# Patient Record
Sex: Female | Born: 1960 | Race: White | Hispanic: No | Marital: Married | Smoking: Never smoker
Health system: Southern US, Community
[De-identification: ages and names within clinical notes are randomized; demographics above are authoritative.]

## PROBLEM LIST (undated history)

## (undated) DIAGNOSIS — R609 Edema, unspecified: Secondary | ICD-10-CM

## (undated) DIAGNOSIS — E785 Hyperlipidemia, unspecified: Secondary | ICD-10-CM

## (undated) DIAGNOSIS — C801 Malignant (primary) neoplasm, unspecified: Secondary | ICD-10-CM

## (undated) DIAGNOSIS — K219 Gastro-esophageal reflux disease without esophagitis: Secondary | ICD-10-CM

## (undated) DIAGNOSIS — G4730 Sleep apnea, unspecified: Secondary | ICD-10-CM

## (undated) DIAGNOSIS — K2270 Barrett's esophagus without dysplasia: Secondary | ICD-10-CM

## (undated) DIAGNOSIS — I10 Essential (primary) hypertension: Secondary | ICD-10-CM

## (undated) HISTORY — DX: Edema, unspecified: R60.9

## (undated) HISTORY — DX: Sleep apnea, unspecified: G47.30

## (undated) HISTORY — PX: NASAL SEPTUM SURGERY: SHX37

## (undated) HISTORY — DX: Barrett's esophagus without dysplasia: K22.70

## (undated) HISTORY — PX: ABDOMINAL HYSTERECTOMY: SHX81

## (undated) HISTORY — DX: Essential (primary) hypertension: I10

## (undated) HISTORY — DX: Hyperlipidemia, unspecified: E78.5

---

## 1999-07-13 ENCOUNTER — Ambulatory Visit (HOSPITAL_COMMUNITY): Admission: RE | Admit: 1999-07-13 | Discharge: 1999-07-13 | Payer: Self-pay | Admitting: Obstetrics and Gynecology

## 1999-07-13 ENCOUNTER — Encounter: Payer: Self-pay | Admitting: Obstetrics and Gynecology

## 2000-09-26 ENCOUNTER — Ambulatory Visit (HOSPITAL_COMMUNITY): Admission: RE | Admit: 2000-09-26 | Discharge: 2000-09-26 | Payer: Self-pay | Admitting: Gastroenterology

## 2000-09-26 ENCOUNTER — Encounter (INDEPENDENT_AMBULATORY_CARE_PROVIDER_SITE_OTHER): Payer: Self-pay | Admitting: Specialist

## 2001-10-01 ENCOUNTER — Other Ambulatory Visit: Admission: RE | Admit: 2001-10-01 | Discharge: 2001-10-01 | Payer: Self-pay | Admitting: Obstetrics and Gynecology

## 2002-12-18 ENCOUNTER — Other Ambulatory Visit: Admission: RE | Admit: 2002-12-18 | Discharge: 2002-12-18 | Payer: Self-pay | Admitting: Obstetrics and Gynecology

## 2002-12-30 ENCOUNTER — Ambulatory Visit (HOSPITAL_COMMUNITY): Admission: RE | Admit: 2002-12-30 | Discharge: 2002-12-30 | Payer: Self-pay | Admitting: Obstetrics and Gynecology

## 2002-12-30 ENCOUNTER — Encounter: Payer: Self-pay | Admitting: Obstetrics and Gynecology

## 2003-03-19 ENCOUNTER — Ambulatory Visit (HOSPITAL_COMMUNITY): Admission: RE | Admit: 2003-03-19 | Discharge: 2003-03-19 | Payer: Self-pay | Admitting: Gastroenterology

## 2003-03-19 ENCOUNTER — Encounter (INDEPENDENT_AMBULATORY_CARE_PROVIDER_SITE_OTHER): Payer: Self-pay | Admitting: *Deleted

## 2004-01-19 ENCOUNTER — Other Ambulatory Visit: Admission: RE | Admit: 2004-01-19 | Discharge: 2004-01-19 | Payer: Self-pay | Admitting: Obstetrics and Gynecology

## 2004-01-20 ENCOUNTER — Ambulatory Visit (HOSPITAL_COMMUNITY): Admission: RE | Admit: 2004-01-20 | Discharge: 2004-01-20 | Payer: Self-pay | Admitting: Obstetrics and Gynecology

## 2004-04-28 ENCOUNTER — Ambulatory Visit (HOSPITAL_COMMUNITY): Admission: RE | Admit: 2004-04-28 | Discharge: 2004-04-28 | Payer: Self-pay | Admitting: Gastroenterology

## 2004-04-28 ENCOUNTER — Encounter (INDEPENDENT_AMBULATORY_CARE_PROVIDER_SITE_OTHER): Payer: Self-pay | Admitting: *Deleted

## 2005-02-23 ENCOUNTER — Ambulatory Visit (HOSPITAL_COMMUNITY): Admission: RE | Admit: 2005-02-23 | Discharge: 2005-02-23 | Payer: Self-pay | Admitting: Obstetrics and Gynecology

## 2005-03-15 ENCOUNTER — Other Ambulatory Visit: Admission: RE | Admit: 2005-03-15 | Discharge: 2005-03-15 | Payer: Self-pay | Admitting: Obstetrics and Gynecology

## 2006-02-27 ENCOUNTER — Ambulatory Visit (HOSPITAL_COMMUNITY): Admission: RE | Admit: 2006-02-27 | Discharge: 2006-02-27 | Payer: Self-pay | Admitting: Obstetrics and Gynecology

## 2006-03-20 ENCOUNTER — Other Ambulatory Visit: Admission: RE | Admit: 2006-03-20 | Discharge: 2006-03-20 | Payer: Self-pay | Admitting: Obstetrics and Gynecology

## 2006-09-07 ENCOUNTER — Ambulatory Visit (HOSPITAL_COMMUNITY): Admission: RE | Admit: 2006-09-07 | Discharge: 2006-09-07 | Payer: Self-pay | Admitting: Obstetrics and Gynecology

## 2007-03-05 ENCOUNTER — Ambulatory Visit (HOSPITAL_COMMUNITY): Admission: RE | Admit: 2007-03-05 | Discharge: 2007-03-05 | Payer: Self-pay | Admitting: Obstetrics and Gynecology

## 2008-03-05 ENCOUNTER — Ambulatory Visit (HOSPITAL_COMMUNITY): Admission: RE | Admit: 2008-03-05 | Discharge: 2008-03-05 | Payer: Self-pay | Admitting: Obstetrics and Gynecology

## 2009-03-18 ENCOUNTER — Ambulatory Visit (HOSPITAL_COMMUNITY): Admission: RE | Admit: 2009-03-18 | Discharge: 2009-03-18 | Payer: Self-pay | Admitting: Obstetrics and Gynecology

## 2009-03-29 ENCOUNTER — Encounter: Admission: RE | Admit: 2009-03-29 | Discharge: 2009-03-29 | Payer: Self-pay | Admitting: Obstetrics and Gynecology

## 2009-04-02 ENCOUNTER — Encounter: Admission: RE | Admit: 2009-04-02 | Discharge: 2009-04-02 | Payer: Self-pay | Admitting: Family Medicine

## 2009-04-05 ENCOUNTER — Encounter: Admission: RE | Admit: 2009-04-05 | Discharge: 2009-04-05 | Payer: Self-pay | Admitting: Family Medicine

## 2009-10-13 ENCOUNTER — Encounter: Admission: RE | Admit: 2009-10-13 | Discharge: 2009-10-13 | Payer: Self-pay | Admitting: Obstetrics and Gynecology

## 2010-03-23 ENCOUNTER — Encounter: Admission: RE | Admit: 2010-03-23 | Discharge: 2010-03-23 | Payer: Self-pay | Admitting: Obstetrics and Gynecology

## 2010-10-07 NOTE — H&P (Signed)
[name], [name]              ACCOUNT NO.:  [id number]   MEDICAL RECORD NO.:  [id number]          PATIENT TYPE:  AMB   LOCATION:  SDC                           FACILITY:  [facility]   PHYSICIAN:  Hal Morales, M.D.DATE OF BIRTH:  August 21, 1960   DATE OF ADMISSION:  09/07/2006  DATE OF DISCHARGE:                              HISTORY & PHYSICAL   The patient is a 50 year old white married female, para 2-0-1-2, who  presents for endometrial ablation for menorrhagia.  The patient had a 5-  year history of heavy menstrual periods which were originally lasting a  total of 5 days with no intermenstrual bleeding and minimal cramping.  Her initial workup in 2001 included a pelvic ultrasound which showed a  1.4 cm fibroid that was not submucosal.  She was started in 2003 on oral  contraceptive pills with good response of her menorrhagia.  She began,  however, to have premenstrual headaches each month.  This had been a  problem previously but on the Yasmin she had them essentially every  month.  She was started on Vivelle patch three days prior to the  beginning of her placebo pills.  She began to have some edema during the  perimenstrual time and was started on hydrochlorothiazide in 2005.  She  tolerated this regimen reasonably well and was able to decrease her  Vivelle patch to 0.0375.  She discontinued the Vivelle in 2006 and  remained without headaches.  She continued to have significant bloating  on the Yasmin and we began to discuss other options for management of  her menorrhagia.  She discontinued the Yasmin and began Motrin 600 mg  q.6h. for the 24 hours that she typically had the heavy bleeding that  was enough to soak through all of her protection and to soil her  clothing.  This did not offer her adequate relief and after discussing  the options of returning to oral contraceptive pills, Mirena IUD,  hysterectomy and endometrial ablation, decided to proceed with  endometrial  ablation.   PAST HISTORY:  Obstetrical:  The patient had cesarean sections for  breech presentation with toxemia and in 1993 as an elective repeat  cesarean section.   GYN history:  The patient had menarche at age 69 with menses every 30  days, usually lasting 4-5 days, with no intermenstrual bleeding and  cramps.  The patient had a history of an abnormal Pap smear that was  treated with laser surgery in 1996 but has had normal Pap smears since  being followed at [facility] since 2001.  Contraception:  Vasectomy.   Medical history:  The patient has a history of gastroesophageal reflux  disease and migraine headaches.   Surgical history:  Third molar extraction, cesarean section x2, and  laser surgery of the cervix.   CURRENT MEDICATIONS:  1. Nexium 40 mg daily.  2. Multivitamin.   HABITS:  The patient eats a regular diet, occasionally exercises, and  occasionally does a self breast examination.  She does not smoke or use  recreational drugs.  She drinks alcoholic beverages approximately twice  per  month.  She works part-time in a clerical position.   REVIEW OF SYSTEMS:  Negative except as mentioned above.   PHYSICAL EXAMINATION:  GENERAL:  The patient is a well-developed white  female in no acute distress.  VITAL SIGNS:  Blood pressure is 120/80, weight is 203 pounds, height is  5 feet 10-1/2 inches.  HEENT:  Within normal limits.  LUNGS:  Clear.  HEART:  Regular rate and rhythm.  BREASTS:  No dominant masses, discharge or skin changes.  ABDOMEN:  Soft without palpable masses or organomegaly though the  examination is compromised by patient's habitus.  EXTREMITIES:  No clubbing, cyanosis, or edema.  PELVIC:  EG/BUS within normal limits.  The vagina is rugous.  The cervix  is without gross lesions.  The uterus is upper limits of normal size,  posterior, and nontender.  Adnexa:  No masses.  Rectovaginal:  No  masses.   ADDITIONAL STUDIES:  Pelvic ultrasound  performed on August 28, 2006,  showed a posterior fibroid measuring 1.5 cm, anterior fibroid measuring  1.7 cm.  The sonohysterogram shows a question of two hyperechoic masses  on the anterior wall, both less than 2 cm, but no submucosal fibroids.  The endometrial measurement by ultrasound is 3.59 x 6.06 cm.  Endometrial biopsy from August 28, 2006, shows benign proliferative  endometrium with no hyperplasia or malignancy.   IMPRESSION:  1. Menorrhagia with the patient having failed oral contraceptive pills      and nonsteroidal anti-inflammatory agents.  2. Perimenstrual headaches exacerbated by oral contraceptives.  3. Small uterine fibroids, which have been stable over the last 7      years.  4. Suggestion of endometrial thickening measuring less than 2 cm each.   DISPOSITION:  The patient has opted for endometrial ablation, and that  will be done at [facility] on September 07, 2006.  The risks of  anesthesia, bleeding, infection, damage to adjacent organs, and uterine  perforation precluding the completion of the procedure, have all been  discussed with the patient.  There has also been a discussion concerning  the possibility of decreased opportunity for efficacy of endometrial  ablation secondary to the patient's anatomic defect of fibroids.  She  expressed understanding and wishes to proceed with endometrial ablation.      Hal Morales, M.D.  Electronically Signed     VPH/MEDQ  D:  09/07/2006  T:  09/07/2006  Job:  [id number]

## 2010-10-07 NOTE — Op Note (Signed)
NAMEORIEL, OJO              ACCOUNT NO.:  [id number]   MEDICAL RECORD NO.:  [id number]          PATIENT TYPE:  AMB   LOCATION:  ENDO                         FACILITY:  [facility]   PHYSICIAN:  Bernette Redbird, M.D.   DATE OF BIRTH:  [birth year]   DATE OF PROCEDURE:  04/28/2004  DATE OF DISCHARGE:                                 OPERATIVE REPORT   PROCEDURE PERFORMED:  Upper endoscopy with biopsies.   ENDOSCOPIST:  [name], M.D.   INDICATIONS FOR PROCEDURE:  The patient is a 50 year old with reflux disease  and short segment Barrett's esophagus, with biopsies a year ago being  indeterminate for low grade dysplasia.   FINDINGS:  Short segment Barrett's esophagus above small hiatal hernia.   DESCRIPTION OF PROCEDURE:  The nature, purpose and risks of the procedure  were familiar to the patient, who provided written consent.  Sedation was  fentanyl  75 mcg and Versed 8 mg IV without arrhythmias or desaturation.  The Olympus video endoscope was passed under direct vision.  The esophagus  was pertinent for a short segment Barrett's esophagus in two tongues, the  longer one extending perhaps 2 cm up the esophagus.  These areas were  biopsied.  There was no evidence of neoplasia, reflux esophagitis, varices,  infection or neoplasia.  No ring or stricture was evident.  Some mild reflux  up into the esophagus was noted during passage of the scope.   The stomach contained essentially no residual and had unremarkable mucosa  without evidence of gastritis, erosions, ulcers, polyps or masses.  A small  hiatal hernia was seen on retroflex viewing of the cardia.  The pylorus,  duodenal bulb and second duodenum were unremarkable.  The patient tolerated  the procedure well and there were no apparent complications.   IMPRESSION:  Short segment Barrett's esophagus, biopsied (16109).   PLAN:  Await pathology results with the interval for the patient's next  endoscopy being dependent on  the histologic findings.       RB/MEDQ  D:  04/28/2004  T:  04/29/2004  Job:  [id number]   cc:   [facility] [place]

## 2010-10-07 NOTE — Procedures (Signed)
[place]  Patient:    [name], [name]                     MRN: [id number] Proc. Date: 09/26/00 Adm. Date:  [id number] Attending:  Rich Brave CC:         Meredith Staggers, M.D.   Procedure Report  PROCEDURE:  Upper endoscopy with biopsies.  ENDOSCOPIST:  [name], M.D.  INDICATIONS:  Longstanding, severe reflux disease with a family history of esophageal cancer which is presumed to have arisen in Barretts esophagus.  FINDINGS:  Short segment Barretts esophagus above a small hiatal hernia.  DESCRIPTION OF PROCEDURE:  The nature, purpose, and risks of the procedure had been discussed with the patient who provided written consent.   Sedation was fentanyl 75 mcg and Versed 7.5 mg IV without arrhythmias or desaturation.  The Olympus video endoscope was passed under direct vision, entering the esophagus easily.  The larynx appeared to have a little bit of edema posteriorly in the region of the posterior commissure.  The esophagus was entered under direct vision.  The proximal esophagus was normal, but the distal esophagus had several short tongues of Barretts-appearing mucosa extending the maximum of about 2 cm up the esophagus.  Biopsies from these tongues were obtained prior to removal of the scope.  No ring or stricture was appreciated, and there was no evidence of reflux esophagitis, free gastroesophageal reflux, varices, infection, or neoplasia.  There was a 2 cm hiatal hernia.  The stomach had no gastritis, erosions, ulcers, polyps, or masses. Retroflexed view showed a slightly patulous diaphragmatic hiatus.  The pylorus, duodenal bulb, and second duodenum looked normal.  The patient tolerated the procedure well, and there were no apparent complications.  IMPRESSION: 1. Short segment Barretts esophagus, pending. 2. Small hiatal hernia.  PLAN:  Await results on biopsies with further management and followup  to depend on the histologic findings. DD:  09/26/00 TD:  09/26/00 Job: [id number]

## 2010-10-07 NOTE — Op Note (Signed)
[name], [name]              ACCOUNT NO.:  [id number]   MEDICAL RECORD NO.:  [id number]          PATIENT TYPE:  AMB   LOCATION:  SDC                           [facility]ACILITY[facility]  [facility]   PHYSICIAN:  Hal Morales, M.D.DATE OF BIRTH:  02/27/1961   DATE OF PROCEDURE:  09/07/2006  DATE OF DISCHARGE:                               OPERATIVE REPORT   PREOPERATIVE DIAGNOSES:  1. Menorrhagia.  2. Uterine fibroids.   POSTOPERATIVE DIAGNOSES:  1. Menorrhagia.  2. Uterine fibroids.   OPERATION:  NovaSure endometrial ablation.   SURGEON:  Dr. Dierdre Forth.   ANESTHESIA:  Local and monitored anesthesia care.   ESTIMATED BLOOD LOSS:  Less than 10 mL.   COMPLICATIONS:  None.   FINDINGS:  The uterus sounded to 9 cm and was posterior.  The cervical  length measured 3.5 cm.  Cavity width measured 4.7 cm.   PROCEDURE:  The patient was taken to the operating room after  appropriate identification and placed on the operating table.  After  placement of equipment for monitored anesthesia care, she was placed in  the lithotomy position.  The perineum and vagina were prepped with  multiple layers of Betadine and draped.  The Graves speculum was placed  in the vagina and a paracervical block achieved with a total of 17 mL of  a mixture of 50% 1% Xylocaine and 50% 0.25% Marcaine for total of 17 mL  in the 5, 7 and 12 o'clock positions.  The cervical length was measured.  The uterus was sounded.  The cervix was dilated to 8 mm, and the  NovaSure apparatus placed into the endometrial cavity and the array  opened.  The array was then seated with the cavity with maximizing at  4.7 cm.  The cervical sleeve was then placed adjacent to the cervix and  the cavity assessment begun.  Once the cavity assessment was passed, the  endometrial ablation was begun and completed in 1 minute and 14 seconds.  The array was then retracted into the apparatus and the apparatus  removed.  The single-toothed tenaculum  which had been placed on the  cervix for stabilization was then removed.  Monsel's solution was used  to achieve hemostasis from the area of tenaculum placement, and the  patient tolerated the procedure well and was taken to the recovery room  in satisfactory condition.      Hal Morales, M.D.  Electronically Signed     VPH/MEDQ  D:  09/07/2006  T:  09/07/2006  Job:  [id number]

## 2010-10-07 NOTE — Op Note (Signed)
   NAME:  [name], [name]                        ACCOUNT NO.:  [id number]   MEDICAL RECORD NO.:  [id number]                   PATIENT TYPE:  AMB   LOCATION:  ENDO                                 FACILITY:  [facility]   PHYSICIAN:  Bernette Redbird, M.D.                DATE OF BIRTH:  1960/12/05   DATE OF PROCEDURE:  03/19/2003  DATE OF DISCHARGE:                                 OPERATIVE REPORT   PROCEDURE:  Upper endoscopy with biopsies.   PERSON:  Bernette Redbird, M.D.   INDICATIONS FOR PROCEDURE:  Follow-up of short segment of Barrett's  esophagus, last biopsied two years ago and noted to have no evidence of  dysplasia at that time.  The patient is maintained on Nexium.   FINDINGS:  Short segment of Barrett's esophagus above 4 cm hiatal hernia.   PROCEDURE:  The nature, purpose and risks of the procedure were familiar to  the patient from prior examination and she provided written consent.  Sedation was Fentanyl 80 mcg and Versed 8 mg IV without arrhythmias or  desaturation.  The Olympus small caliber adult video endoscope was passed  under direct vision. The larynx looked normal.  The esophagus was readily  entered and was normal down to the very distal most portion of the esophagus  where there were several short tongues of jagged Barrett's appearing mucosa,  the longest of which was perhaps 2 cm above the region of the lower  esophageal sphincter and the others of which were 1 to 2 cm in length.  Several biopsies were obtained circumferentially from the area of these  tongues.  There was no evidence of any discrete ring or stricture.  There  was a 4 cm hiatal hernia with the lower esophageal sphincter at about 36 cm.   The stomach contained no residual and had normal mucosa without evidence of  gastritis, erosions, ulcers, polyps or masses.  Retroflex viewing showed a  small hiatal hernia.  The pylorus, duodenal bulb and second duodenum looked  normal.   After obtaining the above  mentioned biopsies from the Barrett's segment, the  scope was removed from the patient who tolerated the procedure well and  without apparent complications.   IMPRESSION:  Short segment of Barrett's esophagus.   PLAN:  Await pathology with anticipated probable colonoscopic follow-up in  about two years.                                               Bernette Redbird, M.D.   RB/MEDQ  D:  03/19/2003  T:  03/19/2003  Job:  [id number]   cc:   Meredith Staggers, M.D.  [address], [address]  [place]  [place] [address]  Fax: [phone]

## 2010-12-02 ENCOUNTER — Other Ambulatory Visit: Payer: Self-pay | Admitting: Internal Medicine

## 2010-12-02 DIAGNOSIS — Z8051 Family history of malignant neoplasm of kidney: Secondary | ICD-10-CM

## 2010-12-08 ENCOUNTER — Ambulatory Visit
Admission: RE | Admit: 2010-12-08 | Discharge: 2010-12-08 | Disposition: A | Discharge status: Home / Self Care | Payer: Managed Care, Other (non HMO) | Source: Ambulatory Visit | Attending: Internal Medicine | Admitting: Internal Medicine

## 2010-12-08 DIAGNOSIS — Z8051 Family history of malignant neoplasm of kidney: Secondary | ICD-10-CM

## 2011-02-16 ENCOUNTER — Other Ambulatory Visit (HOSPITAL_COMMUNITY): Payer: Self-pay | Admitting: Obstetrics and Gynecology

## 2011-02-16 DIAGNOSIS — Z1231 Encounter for screening mammogram for malignant neoplasm of breast: Secondary | ICD-10-CM

## 2011-03-30 ENCOUNTER — Ambulatory Visit (HOSPITAL_COMMUNITY)
Admission: RE | Admit: 2011-03-30 | Discharge: 2011-03-30 | Disposition: A | Discharge status: Home / Self Care | Payer: Managed Care, Other (non HMO) | Source: Ambulatory Visit | Attending: Obstetrics and Gynecology | Admitting: Obstetrics and Gynecology

## 2011-03-30 DIAGNOSIS — Z1231 Encounter for screening mammogram for malignant neoplasm of breast: Secondary | ICD-10-CM | POA: Insufficient documentation

## 2012-03-18 ENCOUNTER — Other Ambulatory Visit: Payer: Self-pay | Admitting: Obstetrics and Gynecology

## 2012-03-18 DIAGNOSIS — Z1231 Encounter for screening mammogram for malignant neoplasm of breast: Secondary | ICD-10-CM

## 2012-04-04 ENCOUNTER — Ambulatory Visit (HOSPITAL_COMMUNITY)
Admission: RE | Admit: 2012-04-04 | Discharge: 2012-04-04 | Disposition: A | Discharge status: Home / Self Care | Payer: Managed Care, Other (non HMO) | Source: Ambulatory Visit | Attending: Obstetrics and Gynecology | Admitting: Obstetrics and Gynecology

## 2012-04-04 DIAGNOSIS — Z1231 Encounter for screening mammogram for malignant neoplasm of breast: Secondary | ICD-10-CM | POA: Insufficient documentation

## 2012-04-22 ENCOUNTER — Ambulatory Visit: Payer: Self-pay | Admitting: Obstetrics and Gynecology

## 2012-05-01 ENCOUNTER — Encounter: Payer: Self-pay | Admitting: Obstetrics and Gynecology

## 2012-05-01 DIAGNOSIS — R922 Inconclusive mammogram: Secondary | ICD-10-CM | POA: Insufficient documentation

## 2012-05-01 DIAGNOSIS — R9230 Dense breasts, unspecified: Secondary | ICD-10-CM | POA: Insufficient documentation

## 2012-05-02 ENCOUNTER — Encounter: Payer: Self-pay | Admitting: Obstetrics and Gynecology

## 2012-05-07 ENCOUNTER — Ambulatory Visit: Payer: Self-pay | Admitting: Obstetrics and Gynecology

## 2012-05-09 ENCOUNTER — Encounter: Payer: Self-pay | Admitting: Obstetrics and Gynecology

## 2012-05-09 ENCOUNTER — Ambulatory Visit (INDEPENDENT_AMBULATORY_CARE_PROVIDER_SITE_OTHER): Payer: Managed Care, Other (non HMO) | Admitting: Obstetrics and Gynecology

## 2012-05-09 VITALS — BP 110/70 | HR 80 | Resp 16 | Ht 70.0 in | Wt 228.0 lb

## 2012-05-09 DIAGNOSIS — Z01419 Encounter for gynecological examination (general) (routine) without abnormal findings: Secondary | ICD-10-CM

## 2012-05-09 DIAGNOSIS — Z8742 Personal history of other diseases of the female genital tract: Secondary | ICD-10-CM | POA: Insufficient documentation

## 2012-05-09 DIAGNOSIS — D259 Leiomyoma of uterus, unspecified: Secondary | ICD-10-CM

## 2012-05-09 DIAGNOSIS — D219 Benign neoplasm of connective and other soft tissue, unspecified: Secondary | ICD-10-CM | POA: Insufficient documentation

## 2012-05-09 NOTE — Progress Notes (Signed)
AEX  Last Pap: 04/11/2010 WNL: Yes Regular Periods:No/Ablation.  Has small amt spotting monthly.  No pain Contraception: Ablation  Monthly Breast exam:no Tetanus<62yrs:yes Nl.Bladder Function:yes Daily BMs:yes Healthy Diet:no Calcium:yes Mammogram:yes Date of Mammogram: 03/2012 WNL Exercise:no Have often Exercise: none Seatbelt: yes Abuse at home: no Stressful work:no Sigmoid-colonoscopy: None Bone Density: No PCP: Rodrigo Ran Change in PMH: None Change in ZOX:WRUE Subjective:    [name] is a 51 y.o. female No obstetric history on file. who presents for annual exam.  The patient has no complaints today.   The following portions of the patient's history were reviewed and updated as appropriate: allergies, current medications, past family history, past medical history, past social history, past surgical history and problem list.  Review of Systems Pertinent items are noted in HPI. Gastrointestinal:No change in bowel habits, no abdominal pain, no rectal bleeding Genitourinary:negative for dysuria, frequency, hematuria, nocturia and urinary incontinence    Objective:     There were no vitals taken for this visit.  Weight:  Wt Readings from Last 1 Encounters:  No data found for Wt     BMI: There is no height or weight on file to calculate BMI. General Appearance: Alert, appropriate appearance for age. No acute distress HEENT: Grossly normal Neck / Thyroid: Supple, no masses, nodes or enlargement Lungs: clear to auscultation bilaterally Back: No CVA tenderness Breast Exam: No masses or nodes.No dimpling, nipple retraction or discharge. Cardiovascular: Regular rate and rhythm. S1, S2, no murmur Gastrointestinal: Soft, non-tender, no masses or organomegaly Pelvic Exam: External genitalia: normal general appearance Vaginal: normal rugae Cervix: Nabothian cysts Adnexa: No masses Uterus: normal single, nontender Rectovaginal: normal rectal, no masses Lymphatic  Exam: Non-palpable nodes in neck, clavicular, axillary, or inguinal regions Skin: no rash or abnormalities Neurologic: Normal gait and speech, no tremor  Psychiatric: Alert and oriented, appropriate affect.    Urinalysis:Not done    Assessment:    Asymptomatic fibroids    Plan:   pap smear due 2014 return annually or prn   Dierdre Forth MD

## 2013-03-11 ENCOUNTER — Other Ambulatory Visit: Payer: Self-pay | Admitting: Obstetrics and Gynecology

## 2013-03-11 DIAGNOSIS — Z1231 Encounter for screening mammogram for malignant neoplasm of breast: Secondary | ICD-10-CM

## 2013-04-07 ENCOUNTER — Other Ambulatory Visit: Payer: Self-pay | Admitting: Gastroenterology

## 2013-04-10 ENCOUNTER — Ambulatory Visit (HOSPITAL_COMMUNITY)
Admission: RE | Admit: 2013-04-10 | Discharge: 2013-04-10 | Disposition: A | Discharge status: Home / Self Care | Payer: Managed Care, Other (non HMO) | Source: Ambulatory Visit | Attending: Obstetrics and Gynecology | Admitting: Obstetrics and Gynecology

## 2013-04-10 DIAGNOSIS — Z1231 Encounter for screening mammogram for malignant neoplasm of breast: Secondary | ICD-10-CM

## 2013-04-15 ENCOUNTER — Other Ambulatory Visit: Payer: Self-pay | Admitting: Obstetrics and Gynecology

## 2013-04-15 DIAGNOSIS — R928 Other abnormal and inconclusive findings on diagnostic imaging of breast: Secondary | ICD-10-CM

## 2013-04-22 ENCOUNTER — Ambulatory Visit
Admission: RE | Admit: 2013-04-22 | Discharge: 2013-04-22 | Disposition: A | Discharge status: Home / Self Care | Payer: Managed Care, Other (non HMO) | Source: Ambulatory Visit | Attending: Obstetrics and Gynecology | Admitting: Obstetrics and Gynecology

## 2013-04-22 DIAGNOSIS — R928 Other abnormal and inconclusive findings on diagnostic imaging of breast: Secondary | ICD-10-CM

## 2014-03-20 ENCOUNTER — Other Ambulatory Visit (HOSPITAL_COMMUNITY): Payer: Self-pay | Admitting: Obstetrics and Gynecology

## 2014-03-20 DIAGNOSIS — Z1231 Encounter for screening mammogram for malignant neoplasm of breast: Secondary | ICD-10-CM

## 2014-04-23 ENCOUNTER — Ambulatory Visit (HOSPITAL_COMMUNITY)
Admission: RE | Admit: 2014-04-23 | Discharge: 2014-04-23 | Disposition: A | Discharge status: Home / Self Care | Payer: Managed Care, Other (non HMO) | Source: Ambulatory Visit | Attending: Obstetrics and Gynecology | Admitting: Obstetrics and Gynecology

## 2014-04-23 DIAGNOSIS — Z1231 Encounter for screening mammogram for malignant neoplasm of breast: Secondary | ICD-10-CM | POA: Insufficient documentation

## 2014-05-22 HISTORY — PX: BASAL CELL CARCINOMA EXCISION: SHX1214

## 2015-03-29 ENCOUNTER — Other Ambulatory Visit: Payer: Self-pay

## 2015-03-29 DIAGNOSIS — Z1231 Encounter for screening mammogram for malignant neoplasm of breast: Secondary | ICD-10-CM

## 2015-04-02 ENCOUNTER — Other Ambulatory Visit: Payer: Self-pay | Admitting: Obstetrics and Gynecology

## 2015-04-05 ENCOUNTER — Other Ambulatory Visit: Payer: Self-pay | Admitting: Obstetrics and Gynecology

## 2015-04-07 NOTE — Patient Instructions (Addendum)
Your procedure is scheduled on:  Monday, Nov. 21, 2016  Enter through the Main Entrance of [facility] at:  6:00 AM  Pick up the phone at the desk and dial [phone].  Call this number if you have problems the morning of surgery: [phone].  Remember: Do NOT eat food or drink after:  Midnight Sunday Take these medicines the morning of surgery with a SIP OF WATER:  Esomeprazole, Nebivolol, chlorthalidone   Do NOT wear jewelry (body piercing), metal hair clips/bobby pins, make-up, or nail polish. Do NOT wear lotions, powders, or perfumes.  You may wear deoderant. Do NOT shave for 48 hours prior to surgery. Do NOT bring valuables to the hospital. Contacts, dentures, or bridgework may not be worn into surgery. Leave suitcase in car.  After surgery it may be brought to your room.  For patients admitted to the hospital, checkout time is 11:00 AM the day of discharge.

## 2015-04-08 ENCOUNTER — Encounter (HOSPITAL_COMMUNITY)
Admission: RE | Admit: 2015-04-08 | Discharge: 2015-04-08 | Disposition: A | Discharge status: Home / Self Care | Payer: BLUE CROSS/BLUE SHIELD | Source: Ambulatory Visit | Attending: Obstetrics and Gynecology | Admitting: Obstetrics and Gynecology

## 2015-04-08 ENCOUNTER — Encounter (HOSPITAL_COMMUNITY): Payer: Self-pay

## 2015-04-08 ENCOUNTER — Other Ambulatory Visit: Payer: Self-pay

## 2015-04-08 DIAGNOSIS — R102 Pelvic and perineal pain: Secondary | ICD-10-CM | POA: Diagnosis not present

## 2015-04-08 DIAGNOSIS — D259 Leiomyoma of uterus, unspecified: Secondary | ICD-10-CM | POA: Insufficient documentation

## 2015-04-08 DIAGNOSIS — N83209 Unspecified ovarian cyst, unspecified side: Secondary | ICD-10-CM | POA: Diagnosis not present

## 2015-04-08 DIAGNOSIS — Z01818 Encounter for other preprocedural examination: Secondary | ICD-10-CM | POA: Insufficient documentation

## 2015-04-08 HISTORY — DX: Gastro-esophageal reflux disease without esophagitis: K21.9

## 2015-04-08 HISTORY — DX: Malignant (primary) neoplasm, unspecified: C80.1

## 2015-04-08 LAB — BASIC METABOLIC PANEL
Anion gap: 8 (ref 5–15)
BUN: 11 mg/dL (ref 6–20)
CHLORIDE: 102 mmol/L (ref 101–111)
CO2: 25 mmol/L (ref 22–32)
Calcium: 9 mg/dL (ref 8.9–10.3)
Creatinine, Ser: 0.79 mg/dL (ref 0.44–1.00)
GFR calc Af Amer: >60 mL/min (ref >60)
GLUCOSE: 126 mg/dL — AB (ref 65–99)
POTASSIUM: 2.8 mmol/L — AB (ref 3.5–5.1)
Sodium: 135 mmol/L (ref 135–145)

## 2015-04-08 LAB — CBC
HCT: 39.4 % (ref 36.0–46.0)
Hemoglobin: 13.6 g/dL (ref 12.0–15.0)
MCH: 30.4 pg (ref 26.0–34.0)
MCHC: 34.5 g/dL (ref 30.0–36.0)
MCV: 88.1 fL (ref 78.0–100.0)
PLATELETS: 302 10*3/uL (ref 150–400)
RBC: 4.47 MIL/uL (ref 3.87–5.11)
RDW: 12.7 % (ref 11.5–15.5)
WBC: 10.7 10*3/uL — ABNORMAL HIGH (ref 4.0–10.5)

## 2015-04-08 NOTE — Pre-Procedure Instructions (Signed)
Patient's potassium level was 2.8 at today's PAT visit. Spoke with Dr. Jillyn Hidden and orders received. Called Dr. Caralee Ates office and spoke with her nurse and made them aware of lab value and that Dr. Jillyn Hidden wants patient to start a potassium supplement.

## 2015-04-09 NOTE — Anesthesia Preprocedure Evaluation (Addendum)
Anesthesia Evaluation  Patient identified by MRN, date of birth, ID band Patient awake    Reviewed: Allergy & Precautions, H&P , NPO status , Patient's Chart, lab work & pertinent test results, reviewed documented beta blocker date and time   Airway Mallampati: I  TM Distance: >3 FB Neck ROM: full    Dental no notable dental hx. (+) Teeth Intact   Pulmonary neg pulmonary ROS,    Pulmonary exam normal        Cardiovascular hypertension, Pt. on home beta blockers Normal cardiovascular exam     Neuro/Psych negative neurological ROS  negative psych ROS   GI/Hepatic Neg liver ROS, GERD  Medicated and Controlled,  Endo/Other  negative endocrine ROS  Renal/GU negative Renal ROS     Musculoskeletal   Abdominal (+) + obese,   Peds  Hematology negative hematology ROS (+)   Anesthesia Other Findings   Reproductive/Obstetrics negative OB ROS                             Anesthesia Physical Anesthesia Plan  ASA: II  Anesthesia Plan: General   Post-op Pain Management:    Induction: Intravenous  Airway Management Planned: Oral ETT  Additional Equipment:   Intra-op Plan:   Post-operative Plan: Extubation in OR  Informed Consent: I have reviewed the patients History and Physical, chart, labs and discussed the procedure including the risks, benefits and alternatives for the proposed anesthesia with the patient or authorized representative who has indicated his/her understanding and acceptance.   Dental Advisory Given  Plan Discussed with: CRNA and Surgeon  Anesthesia Plan Comments: (I was called to discuss this patients K lab result by Dr. Leo Grosser. It was 2.8 and she has been started on oral K-dur for potassium replacement. I relayed that a potassium of >3.0 is typically required for an elective surgery to avoid risks of cardiac ectopy, inotropic depression and even possible malignant  arrythmias that can occur with severe hypokalemia under anesthesia. Upon arrival Ms. Prevette will have her potassium checked stat to ensure it is acceptable for elective surgery.)        Anesthesia Quick Evaluation

## 2015-04-09 NOTE — Pre-Procedure Instructions (Signed)
Called and left message for patient to be here at 8:30 am for her surgery on 04/12/15 so we could check her labs.

## 2015-04-11 MED ORDER — DEXTROSE 5 % IV SOLN
2.0000 g | INTRAVENOUS | Status: AC
  Administered 2015-04-12: 2 g via INTRAVENOUS
  Filled 2015-04-11: qty 2

## 2015-04-12 ENCOUNTER — Ambulatory Visit (HOSPITAL_COMMUNITY): Payer: BLUE CROSS/BLUE SHIELD | Admitting: Anesthesiology

## 2015-04-12 ENCOUNTER — Encounter (HOSPITAL_COMMUNITY): Payer: Self-pay | Admitting: Anesthesiology

## 2015-04-12 ENCOUNTER — Observation Stay (HOSPITAL_COMMUNITY)
Admission: RE | Admit: 2015-04-12 | Discharge: 2015-04-13 | Disposition: A | Discharge status: Home / Self Care | Payer: BLUE CROSS/BLUE SHIELD | Source: Ambulatory Visit | Attending: Obstetrics and Gynecology | Admitting: Obstetrics and Gynecology

## 2015-04-12 ENCOUNTER — Encounter (HOSPITAL_COMMUNITY)
Admission: RE | Disposition: A | Discharge status: Home / Self Care | Payer: Self-pay | Source: Ambulatory Visit | Attending: Obstetrics and Gynecology

## 2015-04-12 DIAGNOSIS — I10 Essential (primary) hypertension: Secondary | ICD-10-CM | POA: Diagnosis present

## 2015-04-12 DIAGNOSIS — R102 Pelvic and perineal pain: Secondary | ICD-10-CM | POA: Diagnosis present

## 2015-04-12 DIAGNOSIS — N800 Endometriosis of uterus: Principal | ICD-10-CM | POA: Insufficient documentation

## 2015-04-12 DIAGNOSIS — K219 Gastro-esophageal reflux disease without esophagitis: Secondary | ICD-10-CM | POA: Diagnosis not present

## 2015-04-12 DIAGNOSIS — Z79899 Other long term (current) drug therapy: Secondary | ICD-10-CM | POA: Insufficient documentation

## 2015-04-12 DIAGNOSIS — D259 Leiomyoma of uterus, unspecified: Secondary | ICD-10-CM | POA: Diagnosis present

## 2015-04-12 HISTORY — PX: LAPAROSCOPIC VAGINAL HYSTERECTOMY WITH SALPINGECTOMY: SHX6680

## 2015-04-12 LAB — BASIC METABOLIC PANEL
Anion gap: 9 (ref 5–15)
BUN: 14 mg/dL (ref 6–20)
CHLORIDE: 103 mmol/L (ref 101–111)
CO2: 25 mmol/L (ref 22–32)
CREATININE: 0.84 mg/dL (ref 0.44–1.00)
Calcium: 9.5 mg/dL (ref 8.9–10.3)
GFR calc Af Amer: >60 mL/min (ref >60)
GLUCOSE: 98 mg/dL (ref 65–99)
Potassium: 3.6 mmol/L (ref 3.5–5.1)
SODIUM: 137 mmol/L (ref 135–145)

## 2015-04-12 LAB — PREGNANCY, URINE: PREG TEST UR: NEGATIVE

## 2015-04-12 SURGERY — HYSTERECTOMY, VAGINAL, LAPAROSCOPY-ASSISTED, WITH SALPINGECTOMY
Anesthesia: General | Laterality: Bilateral

## 2015-04-12 MED ORDER — NEOSTIGMINE METHYLSULFATE 10 MG/10ML IV SOLN
INTRAVENOUS | Status: AC
  Filled 2015-04-12: qty 1

## 2015-04-12 MED ORDER — GLYCOPYRROLATE 0.2 MG/ML IJ SOLN
INTRAMUSCULAR | Status: AC
  Filled 2015-04-12: qty 3

## 2015-04-12 MED ORDER — NEOSTIGMINE METHYLSULFATE 10 MG/10ML IV SOLN
INTRAVENOUS | Status: DC | PRN
  Administered 2015-04-12: 3 mg via INTRAVENOUS

## 2015-04-12 MED ORDER — VASOPRESSIN 20 UNIT/ML IV SOLN
INTRAVENOUS | Status: DC | PRN
  Administered 2015-04-12: 37 mL via INTRAMUSCULAR

## 2015-04-12 MED ORDER — SCOPOLAMINE 1 MG/3DAYS TD PT72
MEDICATED_PATCH | TRANSDERMAL | Status: AC
  Administered 2015-04-12: 1.5 mg via TRANSDERMAL
  Filled 2015-04-12: qty 1

## 2015-04-12 MED ORDER — SCOPOLAMINE 1 MG/3DAYS TD PT72
1.0000 | MEDICATED_PATCH | Freq: Once | TRANSDERMAL | Status: DC
  Administered 2015-04-12: 1.5 mg via TRANSDERMAL

## 2015-04-12 MED ORDER — DIPHENHYDRAMINE HCL 12.5 MG/5ML PO ELIX: 12.5000 mg | ORAL_SOLUTION | Freq: Four times a day (QID) | ORAL | Status: DC | PRN

## 2015-04-12 MED ORDER — HYDROMORPHONE HCL 1 MG/ML IJ SOLN
INTRAMUSCULAR | Status: DC | PRN
  Administered 2015-04-12: 1 mg via INTRAVENOUS

## 2015-04-12 MED ORDER — VASOPRESSIN 20 UNIT/ML IV SOLN
INTRAVENOUS | Status: AC
  Filled 2015-04-12: qty 1

## 2015-04-12 MED ORDER — POTASSIUM CHLORIDE 20 MEQ PO PACK: 20.0000 meq | PACK | Freq: Two times a day (BID) | ORAL | Status: DC

## 2015-04-12 MED ORDER — HYDROMORPHONE HCL 1 MG/ML IJ SOLN
INTRAMUSCULAR | Status: AC
  Filled 2015-04-12: qty 1

## 2015-04-12 MED ORDER — PROPOFOL 10 MG/ML IV BOLUS
INTRAVENOUS | Status: DC | PRN
  Administered 2015-04-12: 180 mg via INTRAVENOUS

## 2015-04-12 MED ORDER — MIDAZOLAM HCL 2 MG/2ML IJ SOLN
INTRAMUSCULAR | Status: DC | PRN
  Administered 2015-04-12: 1.5 mg via INTRAVENOUS
  Administered 2015-04-12: 0.5 mg via INTRAVENOUS

## 2015-04-12 MED ORDER — ONDANSETRON HCL 4 MG/2ML IJ SOLN
INTRAMUSCULAR | Status: AC
  Filled 2015-04-12: qty 2

## 2015-04-12 MED ORDER — SODIUM CHLORIDE 0.9 % IJ SOLN: 9.0000 mL | INTRAMUSCULAR | Status: DC | PRN

## 2015-04-12 MED ORDER — GLYCOPYRROLATE 0.2 MG/ML IJ SOLN
INTRAMUSCULAR | Status: AC
  Filled 2015-04-12: qty 1

## 2015-04-12 MED ORDER — KETOROLAC TROMETHAMINE 30 MG/ML IJ SOLN
INTRAMUSCULAR | Status: DC | PRN
  Administered 2015-04-12: 30 mg via INTRAVENOUS

## 2015-04-12 MED ORDER — KETOROLAC TROMETHAMINE 30 MG/ML IJ SOLN
30.0000 mg | Freq: Once | INTRAMUSCULAR | Status: AC | PRN
Start: 2015-04-12 — End: 2015-04-12

## 2015-04-12 MED ORDER — KETOROLAC TROMETHAMINE 30 MG/ML IJ SOLN
30.0000 mg | Freq: Four times a day (QID) | INTRAMUSCULAR | Status: DC
  Administered 2015-04-12 – 2015-04-13 (×2): 30 mg via INTRAVENOUS
  Filled 2015-04-12 (×2): qty 1

## 2015-04-12 MED ORDER — DEXAMETHASONE SODIUM PHOSPHATE 10 MG/ML IJ SOLN
INTRAMUSCULAR | Status: AC
  Filled 2015-04-12: qty 1

## 2015-04-12 MED ORDER — OXYCODONE-ACETAMINOPHEN 5-325 MG PO TABS: 1.0000 | ORAL_TABLET | ORAL | Status: DC | PRN

## 2015-04-12 MED ORDER — HYDROMORPHONE HCL 1 MG/ML IJ SOLN: 0.2500 mg | INTRAMUSCULAR | Status: DC | PRN

## 2015-04-12 MED ORDER — BUPIVACAINE HCL (PF) 0.25 % IJ SOLN
INTRAMUSCULAR | Status: AC
  Filled 2015-04-12: qty 30

## 2015-04-12 MED ORDER — PROPOFOL 10 MG/ML IV BOLUS
INTRAVENOUS | Status: AC
Start: 2015-04-12 — End: 2015-04-12
  Filled 2015-04-12: qty 20

## 2015-04-12 MED ORDER — FENTANYL CITRATE (PF) 100 MCG/2ML IJ SOLN
INTRAMUSCULAR | Status: DC | PRN
  Administered 2015-04-12: 50 ug via INTRAVENOUS
  Administered 2015-04-12 (×3): 100 ug via INTRAVENOUS

## 2015-04-12 MED ORDER — HYDROMORPHONE 1 MG/ML IV SOLN
INTRAVENOUS | Status: DC
  Administered 2015-04-12: 0.3 mg via INTRAVENOUS
  Administered 2015-04-12: 17:00:00 via INTRAVENOUS
  Administered 2015-04-13: 0.3 mg via INTRAVENOUS
  Filled 2015-04-12: qty 25

## 2015-04-12 MED ORDER — ONDANSETRON HCL 4 MG/2ML IJ SOLN
4.0000 mg | Freq: Four times a day (QID) | INTRAMUSCULAR | Status: DC | PRN
  Filled 2015-04-12: qty 2

## 2015-04-12 MED ORDER — FENTANYL CITRATE (PF) 100 MCG/2ML IJ SOLN
INTRAMUSCULAR | Status: AC
  Filled 2015-04-12: qty 2

## 2015-04-12 MED ORDER — ROCURONIUM BROMIDE 100 MG/10ML IV SOLN
INTRAVENOUS | Status: AC
  Filled 2015-04-12: qty 1

## 2015-04-12 MED ORDER — ONDANSETRON HCL 4 MG/2ML IJ SOLN
INTRAMUSCULAR | Status: DC | PRN
  Administered 2015-04-12 (×2): 2 mg via INTRAVENOUS

## 2015-04-12 MED ORDER — ROCURONIUM BROMIDE 100 MG/10ML IV SOLN
INTRAVENOUS | Status: DC | PRN
  Administered 2015-04-12 (×4): 10 mg via INTRAVENOUS
  Administered 2015-04-12: 40 mg via INTRAVENOUS
  Administered 2015-04-12: 10 mg via INTRAVENOUS

## 2015-04-12 MED ORDER — IBUPROFEN 600 MG PO TABS: 600.0000 mg | ORAL_TABLET | Freq: Four times a day (QID) | ORAL | Status: DC | PRN

## 2015-04-12 MED ORDER — PANTOPRAZOLE SODIUM 40 MG PO TBEC
40.0000 mg | DELAYED_RELEASE_TABLET | Freq: Every day | ORAL | Status: DC
  Administered 2015-04-13: 40 mg via ORAL
  Filled 2015-04-12: qty 1

## 2015-04-12 MED ORDER — GLYCOPYRROLATE 0.2 MG/ML IJ SOLN
INTRAMUSCULAR | Status: DC | PRN
  Administered 2015-04-12: 0.1 mg via INTRAVENOUS
  Administered 2015-04-12: 0.6 mg via INTRAVENOUS
  Administered 2015-04-12: 0.1 mg via INTRAVENOUS

## 2015-04-12 MED ORDER — MIDAZOLAM HCL 2 MG/2ML IJ SOLN
INTRAMUSCULAR | Status: AC
  Filled 2015-04-12: qty 2

## 2015-04-12 MED ORDER — MEPERIDINE HCL 25 MG/ML IJ SOLN: 6.2500 mg | INTRAMUSCULAR | Status: DC | PRN

## 2015-04-12 MED ORDER — NEBIVOLOL HCL 5 MG PO TABS
5.0000 mg | ORAL_TABLET | Freq: Every day | ORAL | Status: DC
  Administered 2015-04-13: 5 mg via ORAL
  Filled 2015-04-12: qty 1

## 2015-04-12 MED ORDER — NALOXONE HCL 0.4 MG/ML IJ SOLN: 0.4000 mg | INTRAMUSCULAR | Status: DC | PRN

## 2015-04-12 MED ORDER — PROMETHAZINE HCL 25 MG/ML IJ SOLN: 6.2500 mg | INTRAMUSCULAR | Status: DC | PRN

## 2015-04-12 MED ORDER — LACTATED RINGERS IV SOLN
INTRAVENOUS | Status: DC
  Administered 2015-04-12 (×3): via INTRAVENOUS

## 2015-04-12 MED ORDER — SODIUM CHLORIDE 0.9 % IJ SOLN
INTRAMUSCULAR | Status: AC
  Filled 2015-04-12: qty 50

## 2015-04-12 MED ORDER — LIDOCAINE HCL (CARDIAC) 20 MG/ML IV SOLN
INTRAVENOUS | Status: AC
  Filled 2015-04-12: qty 5

## 2015-04-12 MED ORDER — DEXAMETHASONE SODIUM PHOSPHATE 10 MG/ML IJ SOLN
INTRAMUSCULAR | Status: DC | PRN
  Administered 2015-04-12: 10 mg via INTRAVENOUS

## 2015-04-12 MED ORDER — BUPIVACAINE HCL (PF) 0.25 % IJ SOLN
INTRAMUSCULAR | Status: DC | PRN
  Administered 2015-04-12: 10 mL
  Administered 2015-04-12: 20 mL

## 2015-04-12 MED ORDER — ONDANSETRON HCL 4 MG PO TABS: 4.0000 mg | ORAL_TABLET | Freq: Three times a day (TID) | ORAL | Status: DC | PRN

## 2015-04-12 MED ORDER — LIDOCAINE HCL (CARDIAC) 20 MG/ML IV SOLN
INTRAVENOUS | Status: DC | PRN
  Administered 2015-04-12: 100 mg via INTRAVENOUS

## 2015-04-12 MED ORDER — FENTANYL CITRATE (PF) 250 MCG/5ML IJ SOLN
INTRAMUSCULAR | Status: AC
  Filled 2015-04-12: qty 5

## 2015-04-12 MED ORDER — KETOROLAC TROMETHAMINE 30 MG/ML IJ SOLN
INTRAMUSCULAR | Status: AC
  Filled 2015-04-12: qty 1

## 2015-04-12 MED ORDER — POTASSIUM CHLORIDE CRYS ER 20 MEQ PO TBCR
20.0000 meq | EXTENDED_RELEASE_TABLET | Freq: Two times a day (BID) | ORAL | Status: DC
  Administered 2015-04-13: 20 meq via ORAL
  Filled 2015-04-12: qty 1

## 2015-04-12 MED ORDER — DIPHENHYDRAMINE HCL 50 MG/ML IJ SOLN: 12.5000 mg | Freq: Four times a day (QID) | INTRAMUSCULAR | Status: DC | PRN

## 2015-04-12 MED ORDER — MENTHOL 3 MG MT LOZG: 1.0000 | LOZENGE | OROMUCOSAL | Status: DC | PRN

## 2015-04-12 MED ORDER — CHLORTHALIDONE 25 MG PO TABS
12.5000 mg | ORAL_TABLET | Freq: Every day | ORAL | Status: DC
  Administered 2015-04-13: 12.5 mg via ORAL
  Filled 2015-04-12: qty 0.5

## 2015-04-12 MED ORDER — LACTATED RINGERS IV SOLN
INTRAVENOUS | Status: DC
  Administered 2015-04-12 – 2015-04-13 (×2): via INTRAVENOUS

## 2015-04-12 SURGICAL SUPPLY — 88 items
CABLE HIGH FREQUENCY MONO STRZ (ELECTRODE) IMPLANT
CANISTER SUCT 3000ML (MISCELLANEOUS) ×4 IMPLANT
CATH ROBINSON RED A/P 16FR (CATHETERS) IMPLANT
CLOSURE WOUND 1/4 X3 (GAUZE/BANDAGES/DRESSINGS)
CLOTH BEACON ORANGE TIMEOUT ST (SAFETY) ×4 IMPLANT
CONT PATH 16OZ SNAP LID 3702 (MISCELLANEOUS) ×4 IMPLANT
CONT SPECI 4OZ STER CLIK (MISCELLANEOUS) IMPLANT
COVER BACK TABLE 60X90IN (DRAPES) ×4 IMPLANT
DECANTER SPIKE VIAL GLASS SM (MISCELLANEOUS) IMPLANT
DRAIN JACKSON PRT FLT 7MM (DRAIN) IMPLANT
DRAPE SHEET LG 3/4 BI-LAMINATE (DRAPES) ×2 IMPLANT
DRAPE WARM FLUID 44X44 (DRAPE) IMPLANT
DRSG COVADERM PLUS 2X2 (GAUZE/BANDAGES/DRESSINGS) ×2 IMPLANT
DRSG OPSITE POSTOP 3X4 (GAUZE/BANDAGES/DRESSINGS) IMPLANT
DRSG OPSITE POSTOP 4X10 (GAUZE/BANDAGES/DRESSINGS) ×1 IMPLANT
DURAPREP 26ML APPLICATOR (WOUND CARE) ×4 IMPLANT
ELECT CAUTERY BLADE 6.4 (BLADE) IMPLANT
ELECT NDL TIP 2.8 STRL (NEEDLE) IMPLANT
ELECT NEEDLE TIP 2.8 STRL (NEEDLE) IMPLANT
ELECT REM PT RETURN 9FT ADLT (ELECTROSURGICAL) ×4
ELECTRODE REM PT RTRN 9FT ADLT (ELECTROSURGICAL) ×2 IMPLANT
EVACUATOR SILICONE 100CC (DRAIN) IMPLANT
EVACUATOR SMOKE 8.L (FILTER) ×4 IMPLANT
FORCEPS CUTTING 33CM 5MM (CUTTING FORCEPS) IMPLANT
FORCEPS CUTTING 45CM 5MM (CUTTING FORCEPS) IMPLANT
GAUZE PACKING 2X5 YD STRL (GAUZE/BANDAGES/DRESSINGS) IMPLANT
GAUZE SPONGE 4X4 16PLY XRAY LF (GAUZE/BANDAGES/DRESSINGS) IMPLANT
GLOVE BIOGEL PI IND STRL 6.5 (GLOVE) ×2 IMPLANT
GLOVE BIOGEL PI IND STRL 7.0 (GLOVE) ×2 IMPLANT
GLOVE BIOGEL PI INDICATOR 6.5 (GLOVE) ×2
GLOVE BIOGEL PI INDICATOR 7.0 (GLOVE) ×2
GLOVE SURG SS PI 6.5 STRL IVOR (GLOVE) ×12 IMPLANT
GOWN STRL REUS W/TWL LRG LVL3 (GOWN DISPOSABLE) ×12 IMPLANT
LEGGING LITHOTOMY PAIR STRL (DRAPES) ×7 IMPLANT
LIQUID BAND (GAUZE/BANDAGES/DRESSINGS) ×3 IMPLANT
NDL MAYO CATGUT SZ4 TPR NDL (NEEDLE) ×1 IMPLANT
NDL SPNL 22GX3.5 QUINCKE BK (NEEDLE) ×1 IMPLANT
NEEDLE HYPO 22GX1.5 SAFETY (NEEDLE) IMPLANT
NEEDLE MAYO CATGUT SZ4 (NEEDLE) ×4 IMPLANT
NEEDLE SPNL 22GX3.5 QUINCKE BK (NEEDLE) IMPLANT
NS IRRIG 1000ML POUR BTL (IV SOLUTION) ×4 IMPLANT
OCCLUDER COLPOPNEUMO (BALLOONS) ×3 IMPLANT
PACK ABDOMINAL GYN (CUSTOM PROCEDURE TRAY) ×1 IMPLANT
PACK LAVH (CUSTOM PROCEDURE TRAY) ×4 IMPLANT
PACK ROBOTIC GOWN (GOWN DISPOSABLE) ×4 IMPLANT
PAD MAGNETIC INST (MISCELLANEOUS) ×4 IMPLANT
PAD OB MATERNITY 4.3X12.25 (PERSONAL CARE ITEMS) ×4 IMPLANT
PAD POSITIONING PINK XL (MISCELLANEOUS) ×4 IMPLANT
PENCIL SMOKE EVAC W/HOLSTER (ELECTROSURGICAL) ×1 IMPLANT
PROTECTOR NERVE ULNAR (MISCELLANEOUS) ×1 IMPLANT
SET IRRIG TUBING LAPAROSCOPIC (IRRIGATION / IRRIGATOR) IMPLANT
SHEARS HARMONIC ACE PLUS 36CM (ENDOMECHANICALS) ×3 IMPLANT
SOLUTION ELECTROLUBE (MISCELLANEOUS) IMPLANT
SPONGE LAP 18X18 X RAY DECT (DISPOSABLE) ×2 IMPLANT
STAPLER VISISTAT 35W (STAPLE) IMPLANT
STRIP CLOSURE SKIN 1/4X3 (GAUZE/BANDAGES/DRESSINGS) IMPLANT
SUT CHROMIC 2 0 TIES 18 (SUTURE) IMPLANT
SUT MNCRL AB 3-0 PS2 27 (SUTURE) ×3 IMPLANT
SUT PDS AB 1 CT  36 (SUTURE)
SUT PDS AB 1 CT 36 (SUTURE) IMPLANT
SUT PLAIN 2 0 XLH (SUTURE) IMPLANT
SUT SILK 0 FSL (SUTURE) IMPLANT
SUT VIC AB 0 CT1 18XCR BRD8 (SUTURE) ×6 IMPLANT
SUT VIC AB 0 CT1 27 (SUTURE) ×4
SUT VIC AB 0 CT1 27XBRD ANBCTR (SUTURE) ×1 IMPLANT
SUT VIC AB 0 CT1 27XCR 8 STRN (SUTURE) ×1 IMPLANT
SUT VIC AB 0 CT1 8-18 (SUTURE) ×12
SUT VIC AB 2-0 CT1 (SUTURE) ×1 IMPLANT
SUT VIC AB 3-0 PS2 18 (SUTURE)
SUT VIC AB 3-0 PS2 18XBRD (SUTURE) ×1 IMPLANT
SUT VIC AB 3-0 SH 27 (SUTURE)
SUT VIC AB 3-0 SH 27X BRD (SUTURE) IMPLANT
SUT VICRYL 0 ENDOLOOP (SUTURE) IMPLANT
SUT VICRYL 0 TIES 12 18 (SUTURE) ×4 IMPLANT
SUT VICRYL 0 UR6 27IN ABS (SUTURE) ×8 IMPLANT
SYR 20CC LL (SYRINGE) ×4 IMPLANT
SYR 30ML LL (SYRINGE) ×3 IMPLANT
SYR 50ML LL SCALE MARK (SYRINGE) ×1 IMPLANT
SYR CONTROL 10ML LL (SYRINGE) IMPLANT
SYR TB 1ML 25GX5/8 (SYRINGE) ×3 IMPLANT
SYR TB 1ML LUER SLIP (SYRINGE) ×1 IMPLANT
TOWEL OR 17X24 6PK STRL BLUE (TOWEL DISPOSABLE) ×8 IMPLANT
TRAY FOLEY CATH SILVER 14FR (SET/KITS/TRAYS/PACK) ×4 IMPLANT
TROCAR BALL TOP DISP 5MM (ENDOMECHANICALS) ×4 IMPLANT
TROCAR XCEL DIL TIP R 11M (ENDOMECHANICALS) ×1 IMPLANT
WARMER LAPAROSCOPE (MISCELLANEOUS) ×4 IMPLANT
WATER STERILE IRR 1000ML POUR (IV SOLUTION) ×1 IMPLANT
YANKAUER SUCT BULB TIP NO VENT (SUCTIONS) ×3 IMPLANT

## 2015-04-12 NOTE — Anesthesia Postprocedure Evaluation (Signed)
Anesthesia Post Note  Patient: [name]  Procedure(s) Performed: Procedure(s) (LRB): LAPAROSCOPIC ASSISTED VAGINAL HYSTERECTOMY WITH SALPINGECTOMY (Bilateral)  Patient location during evaluation: PACU Anesthesia Type: General Level of consciousness: awake and alert Pain management: pain level controlled Vital Signs Assessment: post-procedure vital signs reviewed and stable Respiratory status: spontaneous breathing, nonlabored ventilation, respiratory function stable and patient connected to nasal cannula oxygen Cardiovascular status: blood pressure returned to baseline and stable Postop Assessment: No signs of nausea or vomiting Anesthetic complications: no    Last Vitals:  Filed Vitals:   04/12/15 1535 04/12/15 1545  BP: 125/75 108/79  Pulse: 93 80  Temp: 37.2 C   Resp: 12 8    Last Pain: There were no vitals filed for this visit.               Patrizia Paule

## 2015-04-12 NOTE — Op Note (Signed)
OPERATIVE REPORT   INDICATIONS:abnormal uterine bleeding and pelvic pain  PRE-OP DIAGNOSIS:Uterine Fibroids, Ovarian Cyst, Pelvic Pain   POST-OP DIAGNOSIS;Uterine Fibroids, Ovarian Cyst, PelvicPain   PROCEDURE:Procedure(s) (LRB): LAPAROSCOPIC ASSISTED VAGINAL HYSTERECTOMY WITH SALPINGECTOMY (Bilateral) Aspiration of simple right ovarian cyst. Pelvic peritoneal washings  SURGEON:Milas Schappell P  ASSIST: Elmira Powell PA-C  SPECIMENS:Uterus, cervix and bilateral fallopian tubes.  Peritoneal washings and ovarian cyst fluid  DISPOSITION OF SPECIMEN:Delivered to Histology  [id number]  COMPLICATIONS: none   PATIENT TO:  PACU - hemodynamically stable.  PROCEDURE DETAILS: The patient was taken to the operating room after appropriate identification and placed on the operating table.  After the attainment of adequate general anesthesia the patient was placed in the lithotomy position. The abdomen, perineum and vagina were prepped with multiple layers of Betadine. A Foley catheter was inserted into the bladder and connected to straight drainage.The abdomen was prepped with Chloraprep and allowed to dry.  The abdomen and  perineum were draped as a sterile field. Subumbilical and suprapubic injection of quarter percent Marcaine was undertaken for total of 10 cc. A subumbilical incision was made and carried down to the fascia bluntly. The fascia was incised and the peritoneum entered bluntly. The peritoneum and fascia were tagged with sutures of 0 Vicryl and held. The Hassan cannula was placed in the peritoneal cavity under direct visualization and stay sutures used to hold it in place. The laparoscope was placed through the [place] with the above-noted findings. Suprapubic incisions were made to the right and left of midline, and laparoscopic probe trochars placed through those incisions into the peritoneal cavity under direct visualization.  The right ovarian cyst was aspirated of clear fluid  which was sent for cytology. After peritoneal washings had been obtained from the pelvis laparoscopically. The right round ligament was identified and cauterized. Then cut using the Harmonic Ace system. The anterior leaf of the broad ligament was incised down to the level of the bladder. The right fallopian tube was grasped at its fimbriated in and the mesial salpinx cauterized and cut with the Harmonic Ace to the level of the cornua. The tube was excised at the cornual region and removed from the operative field through the subumbilical port. The utero-ovarian ligament and upper pedicles were then cauterized and cut with the Harmonic Ace. He was stasis during this time was adequate. A similar procedure was carried out on the opposite side, however, the left fallopian tube was left attached to the left cornual region. The utero-ovarian ligament was cauterized and cut with the Harmonic Ace. The anterior leaf of the broad ligament was dissected and cut with some difficulty. At this point, the decision was made to proceed to the vaginal portion of the hysterectomy.  A weighted speculum was placed in the posterior vagina and Lahey tenaculae were placed on the anterior and posterior surfaces of the cervix. The cervicovaginal mucosa was injected with a dilute solution of Pitressin. The cervix was circumscribed. The anterior vaginal mucosa wasbluntly dissected off the anterior cervix and the anterior peritoneum entered. The bladder blade was placed and the bladder elevated. The posterior peritoneum was entered sharply and tagged. Uterosacral ligaments on the right and left side were clamped cut and suture ligated, and the sutures held. The paracervical tissues were then clamped cut and suture ligated. The uterine arteries were clamped cut and suture ligated. The parametral tissues were clamped, cut, and suture ligated. The uterus, cervix, and left fallopian tube were removed from the operative field. Hemostasis was  achieved in the right pelvic sidewall by clamping a bleeder and suture ligating the bleeding pedicle. The McCall culdoplasty sutures were then placed incorporating the uterosacral ligaments on either side, and the intervening peritoneum.  These were held.. The remainder of the vaginal cuff was closed with figure-of-eight sutures incorporating anterior vaginal mucosa, anterior peritoneum, posterior peritoneum and posterior vaginal mucosa in each of the figure-of-eight sutures.  All sutures used were 0-Vicryl.  The McCall culdoplasty sutures were then tied down. The surgeons changed their gowns and gloves and returned to the laparoscopy. The pneumoperitoneum was re-created. The pelvis was copiously irrigated. An area of redundant tissue which had been adjacent to the uterine fundus was excised with the Harmonic Ace and removed through the subumbilical port. Excellent hemostasis was created with bipolar cautery near the right pelvic sidewall. And on the right ovary. Copious irrigation documented. Excellent hemostasis and approximately 60 cc of saline and 20 cc of quarter percent Marcaine were left in the pelvis. All instruments were removed from the peritoneal cavity under direct visualization as the CO2 was allowed to escape. The suprapubic incisions were closed with Dermabond. The subumbilical incision was closed with figure-of-eight sutures of 0 Vicryl in the fascia and a subcuticular suture of 3-0 Monocryl for the skin incision. This was treated with Dermabond as well and a sterile dressing applied.  The patient was awakened from general anesthesia and taken to the recovery room in satisfactory condition having tolerated the procedure well the sponge and instrument counts correct.  Eldred Manges, MD 3:37 PM

## 2015-04-12 NOTE — H&P (Signed)
[name] is an 54 y.o. female. The patient presents for definitive therapy of menorrhagia and pelvic pain. She had the onset of the garage in 2008, with the finding of uterine fibroids. At that time she underwent a NovaSure endometrial ablation with good relief of her menorrhagia and actually had on Boody. In the beginning of 2015. The patient began to have some vaginal bleeding began that was fairly scheduled. She had some pain associated with her bleeding. Over the last year and a half. The patient has had increasingly more pelvic pain even though the vaginal bleeding has never become unmanageable. At this point. She has pain with and without menstrual bleeding. Her most recent ultrasound done in October 2016 showed a suggestion of adenomyosis in addition to her fibroids. She likewise had bilateral simple ovarian cysts and a normal CA-125. At this point the patient wishes to undergo hysterectomy for definitive therapy of her pelvic pain. She wishes to maintain her ovaries if at all possible.  Pertinent Gynecological History: Menses: Irregular Contraception: vasectomy DES exposure: unknown Blood transfusions: none Sexually transmitted diseases: no past history Previous GYN Procedures: Endometrial ablation  Last pap: normal Date: January 2015 normal Pap smear with negative HPV OB History: G2, P2   Menstrual History:  No LMP recorded. Patient has had an ablation.    Past Medical History  Diagnosis Date  . Hypertension   . GERD (gastroesophageal reflux disease)     takes nexium    . 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 Cesarean section      c/s times two   . Nasal septum surgery      Family History  Problem Relation Age of Onset  . Cancer Father     Social History:  reports that she has never smoked. She has never used smokeless tobacco. She reports that she drinks alcohol. She reports that she does not use illicit  drugs.  Allergies:  Allergies  Allergen Reactions  . Tape Other (See Comments)    Blisters and makes her skin peel      Prescriptions prior to admission  Medication Sig Dispense Refill Last Dose  . Calcium Carb-Cholecalciferol (CALCIUM + D3) 600-200 MG-UNIT TABS Take 1 tablet by mouth daily.     . chlorthalidone (HYGROTON) 25 MG tablet Take 12.5 mg by mouth daily.   04/12/2015 at Unknown time  . esomeprazole (NEXIUM) 20 MG capsule Take 20 mg by mouth daily at 12 noon.   04/12/2015 at Unknown time  . Multiple Vitamin (MULTIVITAMIN WITH MINERALS) TABS tablet Take 1 tablet by mouth daily.     . nebivolol (BYSTOLIC) 5 MG tablet Take 5 mg by mouth daily.   04/12/2015 at Unknown time  . potassium chloride (KLOR-CON) 20 MEQ packet Take 20 mEq by mouth 2 (two) times daily.   04/11/2015 at 1800    Review of Systems  Constitutional: Negative.   HENT: Negative.   Eyes: Negative.   Respiratory: Negative.   Cardiovascular: Negative.   Gastrointestinal: Positive for abdominal pain.  Genitourinary: Negative.   Musculoskeletal: Negative.   Skin: Negative.   Neurological: Negative.   Endo/Heme/Allergies: Negative.   Psychiatric/Behavioral: Negative.     Blood pressure 136/95, pulse 82, temperature 97.9 F (36.6 C), temperature source Oral, resp. rate 20, SpO2 99 %. Physical Exam  Constitutional: She is oriented to person, place, and time. She appears well-developed and well-nourished.  obese  HENT:  Head: Normocephalic and atraumatic.  Eyes: EOM are normal.  Neck: Normal range of motion. Neck supple.  Cardiovascular: Normal rate and regular rhythm.   Respiratory: Effort normal and breath sounds normal.  GI: Soft. Bowel sounds are normal.  Well healed midline incisions  Genitourinary:  Pelvic exam:  VULVA: normal appearing vulva with no masses, tenderness or lesions,  VAGINA: normal appearing vagina with normal color and discharge, no lesions,  CERVIX: normal appearing cervix without  discharge or lesions,  UTERUS: uterus enlarged to upper limits nl  week's size,  ADNEXA: normal adnexa in size, nontender and no masses,  RECTAL: normal rectal, no masses,  Entire exam limited by body habitus  Musculoskeletal: Normal range of motion.  Neurological: She is alert and oriented to person, place, and time.  Skin: Skin is warm and dry.  Psychiatric: She has a normal mood and affect.    Results for orders placed or performed during the hospital encounter of 04/12/15 (from the past 24 hour(s))  Pregnancy, urine     Status: None   Collection Time: 04/12/15  8:30 AM  Result Value Ref Range   Preg Test, Ur NEGATIVE NEGATIVE  Basic metabolic panel     Status: None   Collection Time: 04/12/15  8:35 AM  Result Value Ref Range   Sodium 137 135 - 145 mmol/L   Potassium 3.6 3.5 - 5.1 mmol/L   Chloride 103 101 - 111 mmol/L   CO2 25 22 - 32 mmol/L   Glucose, Bld 98 65 - 99 mg/dL   BUN 14 6 - 20 mg/dL   Creatinine, Ser 0.84 0.44 - 1.00 mg/dL   Calcium 9.5 8.9 - 10.3 mg/dL   GFR calc non Af Amer >60 >60 mL/min   GFR calc Af Amer >60 >60 mL/min   Anion gap 9 5 - 15   Recent Results (from the past 2160 hour(s))  CBC     Status: Abnormal   Collection Time: 04/08/15  3:08 PM  Result Value Ref Range   WBC 10.7 (H) 4.0 - 10.5 K/uL   RBC 4.47 3.87 - 5.11 MIL/uL   Hemoglobin 13.6 12.0 - 15.0 g/dL   HCT 39.4 36.0 - 46.0 %   MCV 88.1 78.0 - 100.0 fL   MCH 30.4 26.0 - 34.0 pg   MCHC 34.5 30.0 - 36.0 g/dL   RDW 12.7 11.5 - 15.5 %   Platelets 302 150 - 400 K/uL  Basic metabolic panel     Status: Abnormal   Collection Time: 04/08/15  3:08 PM  Result Value Ref Range   Sodium 135 135 - 145 mmol/L   Potassium 2.8 (L) 3.5 - 5.1 mmol/L   Chloride 102 101 - 111 mmol/L   CO2 25 22 - 32 mmol/L   Glucose, Bld 126 (H) 65 - 99 mg/dL   BUN 11 6 - 20 mg/dL   Creatinine, Ser 0.79 0.44 - 1.00 mg/dL   Calcium 9.0 8.9 - 10.3 mg/dL   GFR calc non Af Amer >60 >60 mL/min   GFR calc Af Amer >60 >60  mL/min    Comment: (NOTE) The eGFR has been calculated using the CKD EPI equation. This calculation has not been validated in all clinical situations. eGFR's persistently <60 mL/min signify possible Chronic Kidney Disease.    Anion gap 8 5 - 15  Pregnancy, urine     Status: None   Collection Time: 04/12/15  8:30 AM  Result Value Ref Range   Preg  Test, Ur NEGATIVE NEGATIVE    Comment:        THE SENSITIVITY OF THIS METHODOLOGY IS >20 mIU/mL.   Basic metabolic panel     Status: None   Collection Time: 04/12/15  8:35 AM  Result Value Ref Range   Sodium 137 135 - 145 mmol/L   Potassium 3.6 3.5 - 5.1 mmol/L   Chloride 103 101 - 111 mmol/L   CO2 25 22 - 32 mmol/L   Glucose, Bld 98 65 - 99 mg/dL   BUN 14 6 - 20 mg/dL   Creatinine, Ser 0.84 0.44 - 1.00 mg/dL   Calcium 9.5 8.9 - 10.3 mg/dL   GFR calc non Af Amer >60 >60 mL/min   GFR calc Af Amer >60 >60 mL/min    Comment: (NOTE) The eGFR has been calculated using the CKD EPI equation. This calculation has not been validated in all clinical situations. eGFR's persistently <60 mL/min signify possible Chronic Kidney Disease.    Anion gap 9 5 - 15   - Assessment #1. Uterine fibroids #2. Probable adenomyosis #3. Pelvic pain #4. Bilateral ovarian simple cysts #5 prior midline incision for cesarean section 2 #6. Recent episode of hypokalemia, now resolved   /Plan:  Recommended hysterectomy for pelvic pain probably associated with adenomyosis. Indications risks and benefits reviewed with the pt and her husband who accompanies her today, for total and supracervical hysterectomy. The differences are reviewed and questions answered about risks of urinary incontinence and vault prolapse, using model for demonstration. The pt wants the first available date for surgery, and has been scheduled for 04/12/15 . We reviewed the probablility that TAH would be needed because of adhesions from two prior C/S with midline incisions, but pt wants to  use laparoscopy to see if LAVH is possible. She hast decided that she wants total hysterectomy.. Rational for plan to remove both tubes reviewed. Risk of 20% given for subsequent need for ovarian removal if both ovaries are retained. She and her husband had the opportunity to ask questions, and voiced understanding at the end of the visit.    Aariyah Sampey P 04/12/2015, 10:21 AM

## 2015-04-12 NOTE — Anesthesia Procedure Notes (Signed)
Procedure Name: Intubation Date/Time: 04/12/2015 10:43 AM Performed by: Mayer Camel, Laelani Vasko A Pre-anesthesia Checklist: Patient identified, Emergency Drugs available, Suction available, Patient being monitored and Timeout performed Patient Re-evaluated:Patient Re-evaluated prior to inductionOxygen Delivery Method: Circle system utilized Preoxygenation: Pre-oxygenation with 100% oxygen Intubation Type: IV induction Ventilation: Mask ventilation without difficulty Laryngoscope Size: Miller and 2 Grade View: Grade II Tube type: Oral Tube size: 7.0 mm Number of attempts: 1 Placement Confirmation: ETT inserted through vocal cords under direct vision,  positive ETCO2 and breath sounds checked- equal and bilateral Secured at: 22 cm Tube secured with: Tape (pink tape) Dental Injury: Teeth and Oropharynx as per pre-operative assessment

## 2015-04-12 NOTE — Progress Notes (Signed)
Day of Surgery Procedure(s) (LRB): LAPAROSCOPIC ASSISTED VAGINAL HYSTERECTOMY WITH SALPINGECTOMY (Bilateral)  Subjective: Patient reports no nausea,or  vomiting and incisional pain well controlled with PCA   Objective: I have reviewed patient's vital signs, intake and output and medications.  General: cooperative and slightly drowsy Resp: clear to auscultation bilaterally Cardio: regular rate and rhythm, S1, S2 normal, no murmur, click, rub or gallop GI: soft, non-tender; bowel sounds normal; no masses,  no organomegaly.  Dressings dry Extremities: extremities normal, atraumatic, no cyanosis or edema Vaginal Bleeding: none  Assessment: s/p Procedure(s): LAPAROSCOPIC ASSISTED VAGINAL HYSTERECTOMY WITH SALPINGECTOMY (Bilateral): stable and progressing well  Plan: Advance diet Encourage ambulation Advance to PO medication,Discontinue IV fluids,discharge home in am  LOS: 0 days    HAYGOOD,VANESSA P 04/12/2015, 6:23 PM

## 2015-04-12 NOTE — Transfer of Care (Signed)
Immediate Anesthesia Transfer of Care Note  Patient: [name]  Procedure(s) Performed: Procedure(s): LAPAROSCOPIC ASSISTED VAGINAL HYSTERECTOMY WITH SALPINGECTOMY (Bilateral)  Patient Location: PACU  Anesthesia Type:General  Level of Consciousness: sedated and patient cooperative  Airway & Oxygen Therapy: Patient connected to nasal cannula oxygen  Post-op Assessment: Report given to RN, Post -op Vital signs reviewed and stable and Patient moving all extremities  Post vital signs: Reviewed and stable  Last Vitals:  Filed Vitals:   04/12/15 0838  BP: 136/95  Pulse: 82  Temp: 36.6 C  Resp: 20    Complications: No apparent anesthesia complications

## 2015-04-12 NOTE — OR Nursing (Signed)
Husband called and updated on patient condition and surgery status.

## 2015-04-13 ENCOUNTER — Encounter (HOSPITAL_COMMUNITY): Payer: Self-pay | Admitting: Obstetrics and Gynecology

## 2015-04-13 DIAGNOSIS — N800 Endometriosis of uterus: Secondary | ICD-10-CM | POA: Diagnosis not present

## 2015-04-13 LAB — CBC
HCT: 31.4 % — ABNORMAL LOW (ref 36.0–46.0)
Hemoglobin: 10.7 g/dL — ABNORMAL LOW (ref 12.0–15.0)
MCH: 30.6 pg (ref 26.0–34.0)
MCHC: 34.1 g/dL (ref 30.0–36.0)
MCV: 89.7 fL (ref 78.0–100.0)
Platelets: 261 10*3/uL (ref 150–400)
RBC: 3.5 MIL/uL — ABNORMAL LOW (ref 3.87–5.11)
RDW: 12.5 % (ref 11.5–15.5)
WBC: 15.5 10*3/uL — ABNORMAL HIGH (ref 4.0–10.5)

## 2015-04-13 LAB — TSH: TSH: 1.806 u[IU]/mL (ref 0.350–4.500)

## 2015-04-13 MED ORDER — DOCUSATE SODIUM 50 MG PO CAPS
ORAL_CAPSULE | ORAL | Status: DC
Start: 2015-04-13 — End: 2016-11-13

## 2015-04-13 MED ORDER — IBUPROFEN 600 MG PO TABS: ORAL_TABLET | ORAL | Status: DC

## 2015-04-13 MED ORDER — POTASSIUM CHLORIDE 20 MEQ PO PACK: 20.0000 meq | PACK | Freq: Every day | ORAL | Status: DC

## 2015-04-13 MED ORDER — OXYCODONE-ACETAMINOPHEN 5-325 MG PO TABS
1.0000 | ORAL_TABLET | ORAL | Status: DC | PRN
Start: 2015-04-13 — End: 2016-11-13

## 2015-04-13 MED ORDER — ONDANSETRON HCL 4 MG PO TABS: 4.0000 mg | ORAL_TABLET | Freq: Three times a day (TID) | ORAL | Status: DC | PRN

## 2015-04-13 NOTE — Progress Notes (Signed)
JULINA BARSE is a60 y.o.  [id number]  Post Op Date # 1: LAVH/BS/Aspiration of Right Ovarian Cyst  Subjective: Patient is Doing well postoperatively. Patient has Pain is controlled with current analgesics. Medications being used: prescription NSAID's including Ketorolac and narcotic analgesics including hydromorphone (Dilaudid). Ambulated last night with no dizziness.  Had single episode of nausea and vomiting last night-none since and has been tolerating liquids. Hasn't voided yet.   Objective: Vital signs in last 24 hours: Temp:  [97.4 F (36.3 C)-99 F (37.2 C)] 98.1 F (36.7 C) (11/22 0502) Pulse Rate:  [70-94] 94 (11/22 0158) Resp:  [8-21] 17 (11/22 0519) BP: (107-136)/(53-95) 114/53 mmHg (11/22 0158) SpO2:  [93 %-99 %] 95 % (11/22 0519) Weight:  [239 lb 8 oz (108.636 kg)] 239 lb 8 oz (108.636 kg) (11/21 2100)  Intake/Output from previous day: 11/21 0701 - 11/22 0700 In: 4879.5 [P.O.:565; I.V.:4314.5] Out: 2450 [Urine:1550] Intake/Output this shift:    Recent Labs Lab 04/08/15 1508 04/13/15 0533  WBC 10.7* 15.5*  HGB 13.6 10.7*  HCT 39.4 31.4*  PLT 302 261     Recent Labs Lab 04/08/15 1508 04/12/15 0835  NA 135 137  K 2.8* 3.6  CL 102 103  CO2 25 25  BUN 11 14  CREATININE 0.79 0.84  CALCIUM 9.0 9.5  GLUCOSE 126* 98    EXAM: General: alert, cooperative and no distress Resp: clear to auscultation bilaterally Cardio: regular rate and rhythm, S1, S2 normal, no murmur, click, rub or gallop GI: Bowel sounds present; abdominal dressings clean/dry/intact. Extremities: Homans sign is negative, no sign of DVT and SCD hose in place. Vaginal Bleeding: none   Assessment: s/p Procedure(s): LAPAROSCOPIC ASSISTED VAGINAL HYSTERECTOMY WITH SALPINGECTOMY: stable, progressing well and anemia  Plan: Advance diet Encourage ambulation Advance to PO medication Consider discharge home later today  LOS: 1 day    Jarrick Fjeld, PA-C 04/13/2015 7:14 AM

## 2015-04-13 NOTE — Discharge Summary (Signed)
Physician Discharge Summary  Patient ID: [name] MRN: [id number] DOB/AGE: [birth year] 55 y.o.  Admit date: 04/12/2015 Discharge date: 04/13/2015   Discharge Diagnoses:  Abnormal Uterine Bleeding, Pelvic Pain and Right Ovarian Cyst Active Problems:   Pelvic pain in female   Essential hypertension, benign   Operation: Laparoscopically Assisted Vaginal Hysterectomy, Bilateral Salpingectomy and Aspiration of Right Ovarian Cyst   Discharged Condition: Good  Hospital Course: On the date of admission the patient underwent the aforementioned procedures and tolerated them well.  Post operative course was unremarkable with the patient resuming bowel and bladder function by post operative day #1 and was therefore deemed ready for discharge home.  Discharge hemoglobin/hematocrit 10.7/31.4.  Disposition: Home to Self Care  Discharge Medications:    Medication List    TAKE these medications        Calcium + D3 600-200 MG-UNIT Tabs  Take 1 tablet by mouth daily.     chlorthalidone 25 MG tablet  Commonly known as:  HYGROTON  Take 12.5 mg by mouth daily.     esomeprazole 20 MG capsule  Commonly known as:  NEXIUM  Take 20 mg by mouth daily at 12 noon.     ibuprofen 600 MG tablet  Commonly known as:  ADVIL,MOTRIN  1  po pc every 6 hours for 5 days and then prn-pain     multivitamin with minerals Tabs tablet  Take 1 tablet by mouth daily.     nebivolol 5 MG tablet  Commonly known as:  BYSTOLIC  Take 5 mg by mouth daily.     ondansetron 4 MG tablet  Commonly known as:  ZOFRAN  Take 1 tablet (4 mg total) by mouth every 8 (eight) hours as needed for nausea or vomiting.     oxyCODONE-acetaminophen 5-325 MG tablet  Commonly known as:  PERCOCET/ROXICET  Take 1-2 tablets by mouth every 4 (four) hours as needed for severe pain (moderate to severe pain (when tolerating fluids)).     potassium chloride 20 MEQ packet  Commonly known as:  KLOR-CON  Take 20 mEq by mouth 2 (two)  times daily.          Follow-up: Dr. Lorriane Shire P. Haygood on May 13, 2015 at 4:15 p.m. for post operative visit or as needed   Signed: Colvin Caroli 04/13/2015, 7:36 AM

## 2015-04-13 NOTE — Discharge Instructions (Signed)
Call [facility] @ [phone] if:  You have a temperature greater than or equal to 100.4 degrees Farenheit orally You have pain that is not made better by the pain medication given and taken as directed You have excessive bleeding or problems urinating  Take Colace (Docusate Sodium/Stool Softener) 100 mg 2-3 times daily while taking narcotic pain medicine to avoid constipation or until bowel movements are regular. Take any over the counter iron supplement of your choice,  twice a day for the next  12 weeks  You may drive after 2 weeks You may walk up steps  You may shower  You may resume a regular diet  Keep incisions clean and dry Do not lift over 15 pounds for 6 weeks Avoid anything in vagina for 6 weeks (or until after your post-operative visit)

## 2015-04-13 NOTE — Addendum Note (Signed)
Addendum  created 04/13/15 0753 by Jonna Munro, CRNA   Modules edited: Clinical Notes, Notes Section   Clinical Notes:  File: [id number]; Pend: [id number]   Notes Section:  Delete: [id number]

## 2015-04-13 NOTE — Anesthesia Postprocedure Evaluation (Signed)
Anesthesia Post Note  Patient: RAFEEF KORNBLUTH  Procedure(s) Performed: Procedure(s) (LRB): LAPAROSCOPIC ASSISTED VAGINAL HYSTERECTOMY WITH SALPINGECTOMY (Bilateral)  Patient location during evaluation: [facility] Anesthesia Type: General Level of consciousness: awake, awake and alert and oriented Pain management: satisfactory to patient Vital Signs Assessment: post-procedure vital signs reviewed and stable Respiratory status: spontaneous breathing Cardiovascular status: stable Postop Assessment: Adequate PO intake and No signs of nausea or vomiting Anesthetic complications: no    Last Vitals:  Filed Vitals:   04/13/15 0502 04/13/15 0519  BP:    Pulse:    Temp: 36.7 C   Resp: 14 17    Last Pain:  Filed Vitals:   04/13/15 0647  PainSc: [name]

## 2015-04-13 NOTE — Progress Notes (Signed)
Pt is discharged in the care of husband. Downstairs per wheelchair with N.T.  Escort. Denies pain or discomfort. Pain med was  offered pt declined. Abdominal incision was clean and dry. Understands all instructions well Questions were asked and answered. Stable.

## 2015-05-03 ENCOUNTER — Ambulatory Visit
Admission: RE | Admit: 2015-05-03 | Discharge: 2015-05-03 | Disposition: A | Discharge status: Home / Self Care | Payer: BLUE CROSS/BLUE SHIELD | Source: Ambulatory Visit

## 2015-05-03 DIAGNOSIS — Z1231 Encounter for screening mammogram for malignant neoplasm of breast: Secondary | ICD-10-CM

## 2016-06-12 ENCOUNTER — Other Ambulatory Visit: Payer: Self-pay | Admitting: Obstetrics and Gynecology

## 2016-06-12 DIAGNOSIS — Z1231 Encounter for screening mammogram for malignant neoplasm of breast: Secondary | ICD-10-CM

## 2016-07-10 ENCOUNTER — Ambulatory Visit
Admission: RE | Admit: 2016-07-10 | Discharge: 2016-07-10 | Disposition: A | Discharge status: Home / Self Care | Payer: 59 | Source: Ambulatory Visit | Attending: Obstetrics and Gynecology | Admitting: Obstetrics and Gynecology

## 2016-07-10 DIAGNOSIS — Z1231 Encounter for screening mammogram for malignant neoplasm of breast: Secondary | ICD-10-CM

## 2016-11-09 ENCOUNTER — Encounter: Payer: Self-pay | Admitting: Neurology

## 2016-11-13 ENCOUNTER — Encounter (INDEPENDENT_AMBULATORY_CARE_PROVIDER_SITE_OTHER): Payer: Self-pay

## 2016-11-13 ENCOUNTER — Ambulatory Visit (INDEPENDENT_AMBULATORY_CARE_PROVIDER_SITE_OTHER): Payer: Managed Care, Other (non HMO) | Admitting: Neurology

## 2016-11-13 ENCOUNTER — Encounter: Payer: Self-pay | Admitting: Neurology

## 2016-11-13 VITALS — BP 123/79 | HR 83 | Resp 20 | Ht 71.0 in | Wt 239.0 lb

## 2016-11-13 DIAGNOSIS — R0683 Snoring: Secondary | ICD-10-CM | POA: Diagnosis not present

## 2016-11-13 DIAGNOSIS — G4710 Hypersomnia, unspecified: Secondary | ICD-10-CM | POA: Diagnosis not present

## 2016-11-13 DIAGNOSIS — E669 Obesity, unspecified: Secondary | ICD-10-CM | POA: Diagnosis not present

## 2016-11-13 DIAGNOSIS — G4730 Sleep apnea, unspecified: Secondary | ICD-10-CM | POA: Diagnosis not present

## 2016-11-13 NOTE — Progress Notes (Signed)
SLEEP MEDICINE CLINIC   Provider:  Larey Forbes, M D  Primary Care Physician:  Tracy Infante, MD   Referring Provider: Crist Infante, MD    Chief Complaint  Patient presents with  . New Patient (Initial Visit)    snoring, never had  a sleep study    HPI:  [name] is a 56 y.o. female , seen here as in a referral/ revisit  from Dr. Joylene Forbes for a sleep consultation.     Chief complaint according to patient : "I never feel real rested". "I feel tired all the time" the patient reports that her husband has complained for years that she snores but he is also suspicious that she may have apnea, her children now have shared this observation with her and recently she showed up hotel room with a friend, who in return urged her to seek a sleep evaluation.  [name] reports that she has been premenopausal since age 76 and last year underwent a hysterectomy, and these last 4-5 years she gained a significant amount of weight. She's not sure if the sleep apnea may have led to weight gain or vice versa. She considers herself a comfort eater.   Sleep habits are as follows: Due to her underlying fatigue she has no problems falling asleep promptly. Her usual bedtime is around 11 PM, and she can sleep through until 7 AM. 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  She does not have to bathroom breaks at night, she shares a bedroom with her husband, she falls asleep on her side and sleeps with 2 pillows. She reports frequent vivid dreams and recently more hot flashes.  Sleep medical history and family sleep history: GERD, on chlorthalidone, Bystolic , on  Statin for Cholesterol.  but HTN, bruxism with mouth guard. No sleep walking history, No TBI history.  ENT surgery- nasal septum surgery at age 10, still a mouth breather.   Social history: married, 2 children, son 33 and daughter 97.  No shift work history. No tobacco use history, ETOH socially, caffeine - coffee in AM , 2 cups of tea at lunch, and no  sodas.    Review of Systems: Out of a complete 14 system review, the patient complains of only the following symptoms, and all other reviewed systems are negative. Weight gain, snoring.  Excessive daytime sleepines, endorsing 3 points for questions 1,2, 4, 5 and 7.  Hot flushes.  Epworth score  15 , Fatigue severity score 61   , depression score n/a    Social History   Social History  . Marital status: Married    Spouse name: N/A  . Number of children: N/A  . Years of education: N/A   Occupational History  . Not on file.   Social History Main Topics  . Smoking status: Never Smoker  . Smokeless tobacco: Never Used  . Alcohol use Yes     Comment: occasional   . Drug use: No  . Sexual activity: Yes    Birth control/ protection: None     Comment: Ablation   Other Topics Concern  . Not on file   Social History Narrative  . No narrative on file    Family History  Problem Relation Age of Onset  . Cancer Father   . Cancer Sister   . Parkinsonism Maternal Grandmother   . Parkinsonism Maternal Grandfather   . Cancer Paternal Grandmother     Past Medical History:  Diagnosis Date  . Barrett esophagus   .  Cancer ([place])    basal cell on scalp   . Edema   . GERD (gastroesophageal reflux disease)    takes nexium    . Hypertension     Past Surgical History:  Procedure Laterality Date  . ABDOMINAL HYSTERECTOMY    . CESAREAN SECTION     c/s times two   . LAPAROSCOPIC VAGINAL HYSTERECTOMY WITH SALPINGECTOMY Bilateral 04/12/2015   Procedure: LAPAROSCOPIC ASSISTED VAGINAL HYSTERECTOMY WITH SALPINGECTOMY;  Surgeon: Eldred Manges, MD;  Location: [facility];  Service: Gynecology;  Laterality: Bilateral;  . NASAL SEPTUM SURGERY      Current Outpatient Prescriptions  Medication Sig Dispense Refill  . atorvastatin (LIPITOR) 10 MG tablet Take 10 mg by mouth daily.    . Calcium Carb-Cholecalciferol (CALCIUM + D3) 600-200 MG-UNIT TABS Take 1 tablet by mouth daily.    .  chlorthalidone (HYGROTON) 25 MG tablet Take 12.5 mg by mouth daily.    Marland Kitchen esomeprazole (NEXIUM) 20 MG capsule Take 20 mg by mouth daily at 12 noon.    . Multiple Vitamin (MULTIVITAMIN WITH MINERALS) TABS tablet Take 1 tablet by mouth daily.    . nebivolol (BYSTOLIC) 5 MG tablet Take 5 mg by mouth daily.    . ondansetron (ZOFRAN) 4 MG tablet Take 1 tablet (4 mg total) by mouth every 8 (eight) hours as needed for nausea or vomiting. 20 tablet 0   No current facility-administered medications for this visit.     Allergies as of 11/13/2016 - Review Complete 11/13/2016  Allergen Reaction Noted  . Tape Other (See Comments) 04/08/2015    Vitals: BP 123/79   Pulse 83   Resp 20   Ht 5\' 11"  (1.803 m)   Wt 239 lb (108.4 kg)   BMI 33.33 kg/m  Last Weight:  Wt Readings from Last 1 Encounters:  11/13/16 239 lb (108.4 kg)   HYQ:MVHQ mass index is 33.33 kg/m.     Last Height:   Ht Readings from Last 1 Encounters:  11/13/16 5\' 11"  (1.803 m)    Physical exam:  General: The patient is awake, alert and appears not in acute distress. The patient is well groomed. Head: Normocephalic, atraumatic. Neck is supple. Mallampati 2  neck circumference:17. Nasal airflow patent , TMJ click evident . Retrognathia is not seen.  Cardiovascular:  Regular rate and rhythm, without  murmurs or carotid bruit, and without distended neck veins. Respiratory: Lungs are clear to auscultation. Skin:  Without evidence of edema, or rash Trunk: BMI is 33.33. The patient's posture is erect  Neurologic exam : The patient is awake and alert, oriented to place and time.   Memory subjective described as intact.  Attention span & concentration ability appears normal.  Speech is fluent,  without dysarthria, dysphonia or aphasia.  Mood and affect are appropriate.  Cranial nerves: Pupils are equal and briskly reactive to light. Funduscopic exam deferred, without  evidence of visual impairment- floaters.. Extraocular movements   in vertical and horizontal planes intact and without nystagmus. Visual fields by finger perimetry are intact.Hearing to finger rub intact. Facial sensation intact to fine touch.Facial motor strength is symmetric and tongue and uvula move midline. Shoulder shrug was symmetrical.  Motor exam:  Normal tone, muscle bulk and symmetric strength in all extremities. Sensory:  Fine touch, pinprick and vibration were tested in all extremities. Proprioception tested in the upper extremities was normal. Coordination: Rapid alternating movements in the fingers/hands was normal. Finger-to-nose maneuver  normal without evidence of ataxia, dysmetria or tremor. Gait  and station: Patient walks without assistive device and is able unassisted to climb up to the exam table. Strength within normal limits. Stance is stable and normal.   Deep tendon reflexes: in the  upper and lower extremities are symmetric and intact.  Assessment:  After physical and neurologic examination, review of laboratory studies,  Personal review of imaging studies, reports of other /same  Imaging studies, results of polysomnography and / or neurophysiology testing and pre-existing records as far as provided in visit., my assessment is   1) Mrs. kennel has been observed and witnessed to snore and to have apnea events. While these do not wake her from sleep they have affected her family and others. She does not report nocturia, she does sometimes wake up with a dry mouth, but she also worse of bruxism guard which may control to some degree apnea and snoring as well. Since she is already used to a dental device, I will order a sleep study and review would specially in light of the possible use of a dental device to treat apnea. This will depend on the degree of apnea and the degree of associated hypoxemia if found.   The patient was advised of the nature of the diagnosed disorder , the treatment options and the  risks for general health and wellness  arising from not treating the condition.   I spent more than 35 minutes of face to face time with the patient.  Greater than 50% of time was spent in counseling and coordination of care. We have discussed the diagnosis and differential and I answered the patient's questions.    Plan:  Treatment plan and additional workup :   HST by [employer] criteria.  RV after HST.  Low carb diet.    Tracy Seat, MD 9/76/7341, 9:37 AM  Certified in Neurology by ABPN Certified in [place] by [facility] [address] [place], [place] [address]

## 2016-11-13 NOTE — Patient Instructions (Signed)

## 2016-12-13 ENCOUNTER — Ambulatory Visit (INDEPENDENT_AMBULATORY_CARE_PROVIDER_SITE_OTHER): Payer: Managed Care, Other (non HMO) | Admitting: Neurology

## 2016-12-13 DIAGNOSIS — G4710 Hypersomnia, unspecified: Secondary | ICD-10-CM | POA: Diagnosis not present

## 2016-12-13 DIAGNOSIS — R0683 Snoring: Secondary | ICD-10-CM

## 2016-12-13 DIAGNOSIS — G4730 Sleep apnea, unspecified: Principal | ICD-10-CM

## 2016-12-18 ENCOUNTER — Telehealth: Payer: Self-pay | Admitting: Neurology

## 2016-12-18 ENCOUNTER — Other Ambulatory Visit: Payer: Self-pay | Admitting: Neurology

## 2016-12-18 DIAGNOSIS — G4733 Obstructive sleep apnea (adult) (pediatric): Secondary | ICD-10-CM

## 2016-12-18 NOTE — Telephone Encounter (Signed)
Pt returned RN's call  Call transferred to RN °

## 2016-12-18 NOTE — Telephone Encounter (Signed)
-----   Message from [name], MD sent at 12/18/2016 12:44 PM EDT ----- I have ordered auto titration 5-15 cm, mask fitting through [facility] approved DME> cc Dr Joylene Draft

## 2016-12-18 NOTE — Procedures (Signed)
[facility] [address]. [place],  [address] NAME: [name]    DOB: [birth year] MEDICAL RECORD No: [id number]   DOS: 12/13/16 REFERRING PHYSICIAN: Crist Infante, MD  STUDY PERFORMED: HST HISTORY: [name] is a 56 year old female with excessive daytime sleepiness.   Chief complaint according to patient: "I never feel real rested". "I feel tired all the time"  The patient reports that her husband has complained for years about her snoring, her children now have told her she "holds her breath at night", and recently she shared a hotel room with a friend, who in return urged her to seek a sleep evaluation.  Mrs. Gunn reports that she has been premenopausal since age 40 and last year underwent a hysterectomy, and these last 4-5 years she gained a significant amount of weight. She's not sure if the sleep apnea may have led to weight gain or vice versa. Edema, HTN, GERD, obesity.    Epworth Sleepiness score endorsed at 15, Fatigue severity score at 61 points. BMI was 33.3.   STUDY RESULTS: Total Recording Time: 7h 51m Total Apnea/Hypopnea Index (AHI): 30.0/hr. RDI was 32.5/hr.  Average Oxygen Saturation: SpO2 at 91% Nadir was 69%, Total desaturation duration was 51 minutes at or below 88%.  Average Heart Rate: 72 bpm (56-95) IMPRESSION: Moderate severe obstructive sleep apnea, snoring, associated with hypoxemia( prolonged time )  RECOMMENDATION: Follow with a CPAP titration.  I certify that I have reviewed the raw data recording prior to the issuance of this report in accordance with the standards of the American Academy of Sleep Medicine (AASM). [name], MD   12-18-2016 Medical Director of [place], ABPN and [place] Accredited AASM Member

## 2016-12-18 NOTE — Telephone Encounter (Signed)
Called to discussed sleep study results. No answer at this time. LVM for pt to call back

## 2016-12-18 NOTE — Telephone Encounter (Signed)
Pt returned call. I advised pt that Dr. Brett Fairy reviewed their sleep study results and found that pt does have moderate sleep apnea. Dr. Brett Fairy recommends that pt start CPAP . I reviewed PAP compliance expectations with the pt. Pt is agreeable to starting a CPAP. I advised pt that an order will be sent to a DME, [facility], and [facility] will call the pt within about one week after they file with the pt's insurance. [facility] will show the pt how to use the machine, fit for masks, and troubleshoot the CPAP if needed. A follow up appt was made for insurance purposes with Dr. Brett Fairy on Mar 21 2017 at 10:00am. Pt verbalized understanding to arrive 15 minutes early and bring their CPAP. A letter with all of this information in it will be mailed to the pt as a reminder. I verified with the pt that the address we have on file is correct. Pt verbalized understanding of results. Pt had no questions at this time but was encouraged to call back if questions arise.

## 2016-12-19 NOTE — Telephone Encounter (Signed)
Entered in error

## 2017-02-14 ENCOUNTER — Telehealth: Payer: Self-pay | Admitting: Neurology

## 2017-02-14 NOTE — Telephone Encounter (Signed)
Pt called in said since starting the CPAP she feels like she has all over inflammation, she has gained 10 pounds in 6 weeks and is swelling. Pt said she has not changed her diet or extra curricular activies. She saw PCP about 3 weeks, she had labs drawn which came back normal. She is wanting to know if anyone else has had these complaints with a CPAP. Please call to advise.

## 2017-02-15 NOTE — Telephone Encounter (Signed)
Called and spoke with the patient and made her aware that after speaking with Dr Brett Fairy she did not feel that her swelling and weight gain was related to CPAP. She states that the CPAP is working well and she does feel better she just couldn't explain why the weight gain and swelling was happening. Pt verbalized understanding and was appreciative for the return call.

## 2017-03-18 ENCOUNTER — Encounter: Payer: Self-pay | Admitting: Neurology

## 2017-03-21 ENCOUNTER — Ambulatory Visit (INDEPENDENT_AMBULATORY_CARE_PROVIDER_SITE_OTHER): Payer: Managed Care, Other (non HMO) | Admitting: Neurology

## 2017-03-21 VITALS — BP 131/80 | HR 71 | Ht 70.0 in | Wt 253.0 lb

## 2017-03-21 DIAGNOSIS — G4733 Obstructive sleep apnea (adult) (pediatric): Secondary | ICD-10-CM | POA: Diagnosis not present

## 2017-03-21 DIAGNOSIS — R609 Edema, unspecified: Secondary | ICD-10-CM | POA: Diagnosis not present

## 2017-03-21 DIAGNOSIS — Z9989 Dependence on other enabling machines and devices: Secondary | ICD-10-CM

## 2017-03-21 DIAGNOSIS — R635 Abnormal weight gain: Secondary | ICD-10-CM | POA: Diagnosis not present

## 2017-03-21 NOTE — Progress Notes (Signed)
SLEEP MEDICINE CLINIC   Provider:  Larey Forbes, M D  Primary Care Physician:  Tracy Infante, MD   Referring Provider: Crist Infante, MD    Chief Complaint  Patient presents with  . Follow-up    pt alone, rm 11, inital CPAP follow up. pt states the CPAP is working well    HPI:  Revisit from 03/21/2017 interval history. Mrs. [name] underwent a home sleep test on 12/13/2016, and was diagnosed with moderate severe obstructive sleep apnea, hypoxemia and loud snoring. Her AHI was 30 per hour, oxygen nadir was 69%, and total time in oxygen desaturation was 51 minutes. I recommended to follow with a CPAP titration which became an auto titration depending on conditions stipulated by her insurance. The patient was placed on auto CPAP between 5 and 15 cm water the 3 cm EPR, has been 100% compliant user for the last 90 and 30 days, with an average user time of 7 hours and 26 minutes at night. The residual AHI is 2.1 per hour of sleep, these are obstructive in nature, no central apneas are emerging. The 95th percentile pressure is 12.3 cm water well within the pressure frame given. She does have minimal air leaks. The patient has felt more energized since using CPAP, her daytime sleepiness has been alleviated, she has felt a significant benefit. Her sleep is more sound and less fragmented.     CONSULT :  [name] is a 56 y.o. female , seen here as in a referral from [name] for a sleep consultation.  Chief complaint according to patient : "I never feel truely rested". "I feel tired all the time".  The patient reports that her husband has complained for years that she snores but he is also suspicious that she may have apnea, her children now have shared this observation with her . Recently she shared a hotel room with a friend, who in return urged her to seek a sleep evaluation.  [name] reports that she has been perimenopausal since age 33 and last year underwent a hysterectomy,  and these last 4-5 years she gained a significant amount of weight. She's not sure if the sleep apnea may have led to weight gain or vice versa. She considers herself a comfort eater.   Sleep habits are as follows: Due to her underlying fatigue she has no problems falling asleep promptly. Her usual bedtime is around 11 PM, and she can sleep through until 7 AM. 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  She does not have to bathroom breaks at night, she shares a bedroom with her husband, she falls asleep on her side and sleeps with 2 pillows. She reports frequent vivid dreams and recently more hot flashes.  Sleep medical history and family sleep history: GERD, on chlorthalidone, Bystolic , on  Statin for Cholesterol.  but HTN, bruxism with mouth guard. No sleep walking history, No TBI history.  ENT surgery- nasal septum surgery at age 50, still a mouth breather.   Social history: married, 2 children, son 21 and daughter 63.  No shift work history. No tobacco use history, ETOH socially, caffeine - coffee in AM , 2 cups of tea at lunch, and no sodas.    Review of Systems: Out of a complete 14 system review, the patient complains of only the following symptoms, and all other reviewed systems are negative. Weight gain, snoring.  Excessive daytime sleepiness has resolved,no longer hot flushes.    Swelling in many joints, hands and feet,  painful.  Uses a dream wear mask.  Epworth score  Now 1 points from pre CPAP at 15 points  , Fatigue severity score now 9 from 61   , depression score n/a     Social History   Social History  . Marital status: Married    Spouse name: N/A  . Number of children: N/A  . Years of education: N/A   Occupational History  . Not on file.   Social History Main Topics  . Smoking status: Never Smoker  . Smokeless tobacco: Never Used  . Alcohol use Yes     Comment: occasional   . Drug use: No  . Sexual activity: Yes    Birth control/ protection: None     Comment: Ablation    Other Topics Concern  . Not on file   Social History Narrative  . No narrative on file    Family History  Problem Relation Age of Onset  . Cancer Father   . Cancer Sister   . Parkinsonism Maternal Grandmother   . Parkinsonism Maternal Grandfather   . Cancer Paternal Grandmother     Past Medical History:  Diagnosis Date  . Barrett esophagus   . Cancer ([place])    basal cell on scalp   . Edema   . GERD (gastroesophageal reflux disease)    takes nexium    . Hypertension     Past Surgical History:  Procedure Laterality Date  . ABDOMINAL HYSTERECTOMY    . CESAREAN SECTION     c/s times two   . LAPAROSCOPIC VAGINAL HYSTERECTOMY WITH SALPINGECTOMY Bilateral 04/12/2015   Procedure: LAPAROSCOPIC ASSISTED VAGINAL HYSTERECTOMY WITH SALPINGECTOMY;  Surgeon: Tracy Manges, MD;  Location: [facility];  Service: Gynecology;  Laterality: Bilateral;  . NASAL SEPTUM SURGERY      Current Outpatient Prescriptions  Medication Sig Dispense Refill  . atorvastatin (LIPITOR) 10 MG tablet Take 10 mg by mouth daily.    . Calcium Carb-Cholecalciferol (CALCIUM + D3) 600-200 MG-UNIT TABS Take 1 tablet by mouth daily.    . chlorthalidone (HYGROTON) 25 MG tablet Take 25 mg by mouth daily.     Marland Kitchen esomeprazole (NEXIUM) 20 MG capsule Take 20 mg by mouth daily at 12 noon.    . Multiple Vitamin (MULTIVITAMIN WITH MINERALS) TABS tablet Take 1 tablet by mouth daily.    . nebivolol (BYSTOLIC) 5 MG tablet Take 5 mg by mouth daily.     No current facility-administered medications for this visit.     Allergies as of 03/21/2017 - Review Complete 03/21/2017  Allergen Reaction Noted  . Tape Other (See Comments) 04/08/2015    Vitals: BP 131/80   Pulse 71   Ht 5\' 10"  (1.778 m)   Wt 253 lb (114.8 kg)   BMI 36.30 kg/m  Last Weight:  Wt Readings from Last 1 Encounters:  03/21/17 253 lb (114.8 kg)   EPP:IRJJ mass index is 36.3 kg/m.     Last Height:   Ht Readings from Last 1 Encounters:  03/21/17 5'  10" (1.778 m)    Physical exam:  General: The patient is awake, alert and appears not in acute distress. The patient is well groomed. Head: Normocephalic, atraumatic. Neck is supple. Mallampati 2  neck circumference:17. Nasal airflow patent .  Cardiovascular:  Regular rate and rhythm, without  murmurs or carotid bruit, and without distended neck veins. Respiratory: Lungs are clear to auscultation. Skin:  Ankle edema, bilaterally, pitting edema- slow capillary refill. Fingers and wrists are puffy-  wedding ring sits very tight.  Trunk: BMI is 36.3. The patient's posture is erect-   Neurologic exam : The patient is awake and alert, oriented to place and time.   Cranial nerves: taste and smell are unchanged -Pupils are equal and briskly reactive to light.  Reports blurred vision. Visual fields by finger perimetry are intact.Hearing to finger rub intact. Facial sensation intact to fine touch.Facial motor strength is symmetric and tongue and uvula move midline. Shoulder shrug was symmetrical.  Motor exam:  Normal tone, muscle bulk and symmetric strength in all extremities. Sensory:  Fine touch, pinprick and vibration were normal. She reports joint swelling and pain.  Deep tendon reflexes: in the  upper and lower extremities are symmetric and intact.  Assessment:  After physical and neurologic examination, review of laboratory studies,  Personal review of imaging studies, reports of other /same  Imaging studies, results of polysomnography and / or neurophysiology testing and pre-existing records as far as provided in visit., my assessment is   1) OSA , moderate - severe by HST AHI of 30/ hr, now controlled on auto CPAP, AHI reduced to 2.1 , obstructive residual apnea only.  Epworth from 15 points reduced to 1 point.   I spent more than 25 minutes of face to face time with the patient.  Greater than 50% of time was spent in counseling and coordination of care. We have discussed the diagnosis and  differential and I answered the patient's questions.    Plan:  Treatment plan and additional workup :  BMI reduction advised. Will promote a low carb diet, I advised the patient that her joint pain and swelling is not related to CPAP use, but may benefit from regular exercise and low carb diet. Consider a plant based diet. Reduce sodium, wear compression stockings. Dr Perini's office has done blood work.  Discussed sed rate, ANA and C reactive protein.  Encouraged visit to ophthalmology- blurred vision can be sign of aging, could be diabetes,  Reports dry eyes- sicca?  RV in 12 month     Tracy Seat, MD 62/26/3335, 45:62 AM  Certified in Neurology by ABPN Certified in [place] by [facility] [address], [place], [place] [address]

## 2017-03-26 ENCOUNTER — Other Ambulatory Visit: Payer: Self-pay | Admitting: Internal Medicine

## 2017-03-26 DIAGNOSIS — I10 Essential (primary) hypertension: Secondary | ICD-10-CM

## 2017-03-26 DIAGNOSIS — R609 Edema, unspecified: Secondary | ICD-10-CM

## 2017-03-29 ENCOUNTER — Ambulatory Visit (HOSPITAL_COMMUNITY): Payer: Managed Care, Other (non HMO) | Attending: Cardiology

## 2017-03-29 ENCOUNTER — Other Ambulatory Visit: Payer: Self-pay

## 2017-03-29 DIAGNOSIS — Z8249 Family history of ischemic heart disease and other diseases of the circulatory system: Secondary | ICD-10-CM | POA: Diagnosis not present

## 2017-03-29 DIAGNOSIS — R609 Edema, unspecified: Secondary | ICD-10-CM

## 2017-03-29 DIAGNOSIS — I10 Essential (primary) hypertension: Secondary | ICD-10-CM

## 2017-04-10 ENCOUNTER — Other Ambulatory Visit: Payer: Self-pay | Admitting: Cardiology

## 2017-04-10 DIAGNOSIS — I872 Venous insufficiency (chronic) (peripheral): Secondary | ICD-10-CM

## 2017-04-26 ENCOUNTER — Ambulatory Visit
Admission: RE | Admit: 2017-04-26 | Discharge: 2017-04-26 | Disposition: A | Discharge status: Home / Self Care | Payer: Managed Care, Other (non HMO) | Source: Ambulatory Visit | Attending: Cardiology | Admitting: Cardiology

## 2017-04-26 DIAGNOSIS — I872 Venous insufficiency (chronic) (peripheral): Secondary | ICD-10-CM

## 2017-04-26 NOTE — Consult Note (Signed)
Chief Complaint: Patient was seen in consultation today for  Chief Complaint  Patient presents with  . Advice Only    Consult for Venous Insufficiency   at the request of Vyas,Chandra K  Referring Physician(s): Vyas,Chandra K; Perini, Mark  History of Present Illness: [name] is a 56 y.o. female with long-standing lower extremity edema but significantly worst this June. The swelling was predominantly in the ankles and calf. Swelling has markedly improved recently since Lasix dose was increased and started wearing compression stockings regularly. The patient has knee-high compression stockings measuring 20-30 mmHg. No history of DVTs or trauma in the lower extremities. The edema started approximately 30 years ago during pregnancy when she had toxemia. When the swelling is at its worst, she complains of heaviness and discomfort but does not have significant pain. She denies any sores, bleeding or weeping from the lower extremities. Past medical history is significant for hypertension.  Past Medical History:  Diagnosis Date  . Barrett esophagus   . Cancer ([place])    basal cell on scalp   . Edema   . GERD (gastroesophageal reflux disease)    takes nexium    . Hypertension     Past Surgical History:  Procedure Laterality Date  . ABDOMINAL HYSTERECTOMY    . CESAREAN SECTION     c/s times two   . LAPAROSCOPIC VAGINAL HYSTERECTOMY WITH SALPINGECTOMY Bilateral 04/12/2015   Procedure: LAPAROSCOPIC ASSISTED VAGINAL HYSTERECTOMY WITH SALPINGECTOMY;  Surgeon: Eldred Manges, MD;  Location: [facility];  Service: Gynecology;  Laterality: Bilateral;  . NASAL SEPTUM SURGERY      Allergies: Tape  Medications: Prior to Admission medications   Medication Sig Start Date End Date Taking? Authorizing Provider  atorvastatin (LIPITOR) 10 MG tablet Take 10 mg by mouth daily.   Yes [provider]  Calcium Carb-Cholecalciferol (CALCIUM + D3) 600-200 MG-UNIT TABS Take 1 tablet  by mouth daily.   Yes [provider]  chlorthalidone (HYGROTON) 25 MG tablet Take 25 mg by mouth daily.    Yes [provider]  esomeprazole (NEXIUM) 20 MG capsule Take 20 mg by mouth daily at 12 noon.   Yes [provider]  Multiple Vitamin (MULTIVITAMIN WITH MINERALS) TABS tablet Take 1 tablet by mouth daily.   Yes [provider]  nebivolol (BYSTOLIC) 5 MG tablet Take 5 mg by mouth daily.   Yes [provider]     Family History  Problem Relation Age of Onset  . Cancer Father   . Cancer Sister   . Parkinsonism Maternal Grandmother   . Parkinsonism Maternal Grandfather   . Cancer Paternal Grandmother     Social History   Socioeconomic History  . Marital status: Married    Spouse name: Not on file  . Number of children: Not on file  . Years of education: Not on file  . Highest education level: Not on file  Social Needs  . Financial resource strain: Not on file  . Food insecurity - worry: Not on file  . Food insecurity - inability: Not on file  . Transportation needs - medical: Not on file  . Transportation needs - non-medical: Not on file  Occupational History  . Not on file  Tobacco Use  . Smoking status: Never Smoker  . Smokeless tobacco: Never Used  Substance and Sexual Activity  . Alcohol use: Yes    Comment: occasional   . Drug use: No  . Sexual activity: Yes  Birth control/protection: None    Comment: Ablation  Other Topics Concern  . Not on file  Social History Narrative  . Not on file      Review of Systems  Constitutional: Negative for activity change.  Skin: Negative for color change and wound.    Vital Signs: BP 119/65   Pulse 63   Temp 98.3 F (36.8 C) (Oral)   Resp 16   Ht 5\' 10"  (1.778 m)   Wt 245 lb (111.1 kg)   SpO2 97%   BMI 35.15 kg/m   Physical Exam  Constitutional: She is oriented to person, place, and time. No distress.  Cardiovascular: Intact distal pulses.  Palpable right foot  pulses. Left foot pulses were identified with Doppler.  Musculoskeletal: She exhibits edema.  Grade 1 pitting edema in the bilateral ankles.  No ulcerations or wounds.  No varicosities. Small telangiectasia along the left lateral calf.  Neurological: She is alert and oriented to person, place, and time.         Imaging: US Venous Img Lower Bilateral  Result Date: 04/26/2017 CLINICAL DATA:  56 year old female with bilateral lower extremity edema. Symptoms have improved with the diuretics and lower extremity compression stockings. Evaluate for venous insufficiency. EXAM: BILATERAL LOWER EXTREMITY VENOUS DUPLEX ULTRASOUND TECHNIQUE: Gray-scale sonography with graded compression, as well as color Doppler and duplex ultrasound, were performed to evaluate the deep and superficial veins of both lower extremities. Spectral Doppler was utilized to evaluate flow at rest and with distal augmentation maneuvers. A complete superficial venous insufficiency exam was performed in the upright standing position. I personally performed the technical portion of the exam. COMPARISON:  None. FINDINGS: Right lower extremity: Deep venous system: Normal compressibility, augmentation and color Doppler flow in the right common femoral vein, right femoral vein and right popliteal vein without thrombus. The right saphenofemoral junction is patent. Right profunda femoral vein is patent without thrombus. Visualized right deep calf veins are patent without thrombus. Superficial venous system: Normal caliber of the right great saphenous vein without reflux. Normal caliber of the right short saphenous vein without reflux. No varicosities. Left lower extremity: Deep venous system: Normal compressibility, augmentation and color Doppler flow in the left common femoral vein, left femoral vein and left popliteal vein without thrombus. The left saphenofemoral junction is patent. Left profunda femoral vein is patent without thrombus.  Visualized left deep calf veins are patent without thrombus. Superficial venous system: Normal caliber of the left great saphenous vein without reflux. Normal caliber of the left short saphenous vein without reflux. No varicosities. IMPRESSION: No evidence for superficial venous insufficiency or reflux in the lower extremities. No evidence of deep venous thrombosis in the lower extremities. Electronically Signed   By: Markus Daft M.D.   On: 04/26/2017 12:20   [name] [facility] Eval And Mgmt  Result Date: 04/26/2017 Please refer to "Notes" to see consult details.   Labs:  CBC: No results for input(s): WBC, HGB, HCT, PLT in the last 8760 hours.  COAGS: No results for input(s): INR, APTT in the last 8760 hours.  BMP: No results for input(s): NA, K, CL, CO2, GLUCOSE, BUN, CALCIUM, CREATININE, GFRNONAA, GFRAA in the last 8760 hours.  Invalid input(s): CMP  LIVER FUNCTION TESTS: No results for input(s): BILITOT, AST, ALT, ALKPHOS, PROT, ALBUMIN in the last 8760 hours.  TUMOR MARKERS: No results for input(s): AFPTM, CEA, CA199, CHROMGRNA in the last 8760 hours.  Assessment and Plan:   56 year old with chronic and recently worsening lower extremity  edema. The lower extremity edema has markedly improved on diuretics and using lower extremity compression stockings. Lower extremity venous insufficiency examination was performed today. There is no evidence for superficial venous insufficiency in the lower extremities  and no evidence for deep venous thrombosis.  Explained to the patient that her lower extremity edema does not appear to be related to venous insufficiency. Recommend that she continue to wear the compression stockings, particularly when she will be standing or sitting for long periods of time.   Patient will continue to follow-up with her primary physician and cardiologist.  Thank you for this interesting consult.  I greatly enjoyed meeting MARILY KONCZAL and look forward to participating in  their care.  A copy of this report was sent to the requesting provider on this date.  Electronically Signed: Burman Riis 04/26/2017, 12:56 PM   I spent a total of  15 Minutes   in face to face in clinical consultation, greater than 50% of which was counseling/coordinating care for lower extremity edema.

## 2017-06-04 ENCOUNTER — Other Ambulatory Visit: Payer: Self-pay | Admitting: Obstetrics and Gynecology

## 2017-06-04 DIAGNOSIS — Z1231 Encounter for screening mammogram for malignant neoplasm of breast: Secondary | ICD-10-CM

## 2017-07-11 ENCOUNTER — Ambulatory Visit
Admission: RE | Admit: 2017-07-11 | Discharge: 2017-07-11 | Disposition: A | Discharge status: Home / Self Care | Payer: Managed Care, Other (non HMO) | Source: Ambulatory Visit | Attending: Obstetrics and Gynecology | Admitting: Obstetrics and Gynecology

## 2017-07-11 DIAGNOSIS — Z1231 Encounter for screening mammogram for malignant neoplasm of breast: Secondary | ICD-10-CM

## 2018-03-20 ENCOUNTER — Encounter: Payer: Self-pay | Admitting: Neurology

## 2018-03-25 ENCOUNTER — Encounter: Payer: Self-pay | Admitting: Neurology

## 2018-03-25 ENCOUNTER — Ambulatory Visit: Payer: Managed Care, Other (non HMO) | Admitting: Neurology

## 2018-03-25 VITALS — BP 116/70 | HR 75 | Ht 70.0 in | Wt 257.0 lb

## 2018-03-25 DIAGNOSIS — Z9989 Dependence on other enabling machines and devices: Secondary | ICD-10-CM

## 2018-03-25 DIAGNOSIS — G4733 Obstructive sleep apnea (adult) (pediatric): Secondary | ICD-10-CM

## 2018-03-25 NOTE — Progress Notes (Signed)
SLEEP MEDICINE CLINIC   Provider:  [name], M.D.   Primary Care Physician:  Crist Infante, MD   Referring Provider: Crist Infante, MD    HPI: Today is 20 April 2017 and I have the pleasure of meeting with [name], again I mean by 57 year old sleep apnea patient who has been using CPAP for 1.5 years now.  She had a home sleep test on 18 December 2016 which had indicated severe apnea with an AHI of 30, RDI 32.5, and prolonged oxygen desaturations she soon after started on an auto- titration capable CPAP between 5 and 15 cmH2O ,with 3 cm EPR, provided by [facility].  She remains 100% compliance as she was last year in the same time.  Average use of time is 7 hours and 22 minutes for this patient, the residual AHI is 1.8 apneas per hour, the 95th percentile pressure is 11.6 cm water right within the pressure window and she does have very very minor air leaks.    No changes to the settings or interface have to be made and the patient states she is a very happy CPAP user sleeping better feeling well neither reporting excessive fatigue more sleepiness the fatigue severity score was today endorsed at 11 out of 63 possible points and the Epworth sleepiness score at 4 out of possible 24 points.  The patient can follow yearly alternating between my nurse practitioner.    Revisit from 03/21/2017/  interval history. [name] underwent a home sleep test on 12/13/2016, and was diagnosed with moderate severe obstructive sleep apnea, hypoxemia and loud snoring. Her AHI was 30 per hour, oxygen nadir was 69%, and total time in oxygen desaturation was 51 minutes. I recommended to follow with a CPAP titration which became an auto titration depending on conditions stipulated by her insurance. The patient was placed on auto CPAP between 5 and 15 cm water the 3 cm EPR, has been 100% compliant user for the last 90 and 30 days, with an average user time of 7 hours and 26 minutes at night. The residual AHI  is 2.1 per hour of sleep, these are obstructive in nature, no central apneas are emerging. The 95th percentile pressure is 12.3 cm water well within the pressure frame given. She does have minimal air leaks. The patient has felt more energized since using CPAP, her daytime sleepiness has been alleviated, she has felt a significant benefit. Her sleep is more sound and less fragmented.     CONSULT :  [name] is a 57 y.o. female , seen here as in a referral from Dr. Joylene Draft for a sleep consultation.  Chief complaint according to patient : "I never feel truely rested". "I feel tired all the time".  The patient reports that her husband has complained for years that she snores but he is also suspicious that she may have apnea, her children now have shared this observation with her . Recently she shared a hotel room with a friend, who in return urged her to seek a sleep evaluation.  [name] reports that she has been perimenopausal since age 61 and last year underwent a hysterectomy, and these last 4-5 years she gained a significant amount of weight. She's not sure if the sleep apnea may have led to weight gain or vice versa. She considers herself a comfort eater.   Sleep habits are as follows: Due to her underlying fatigue she has no problems falling asleep promptly. Her usual bedtime is  around 11 PM, and she can sleep through until 7 AM. 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  She does not have to bathroom breaks at night, she shares a bedroom with her husband, she falls asleep on her side and sleeps with 2 pillows. She reports frequent vivid dreams and recently more hot flashes.  Sleep medical history and family sleep history: GERD, on chlorthalidone, Bystolic , on  Statin for Cholesterol.  but HTN, bruxism with mouth guard. No sleep walking history, No TBI history.  ENT surgery- nasal septum surgery at age 6, still a mouth breather.   Social history: married, 2 children, son 26 and daughter 52.  No  shift work history. No tobacco use history, ETOH socially, caffeine - coffee in AM , 2 cups of tea at lunch, and no sodas.    Review of Systems: Out of a complete 14 system review, the patient complains of only the following symptoms, and all other reviewed systems are negative. Weight gain, snoring.  Excessive daytime sleepiness has resolved,no longer hot flushes.    Swelling in many joints, hands and feet, painful.  Uses a dream wear mask.  Epworth score  Now 1 points from pre CPAP at 15 points  , Fatigue severity score now 9 from 61   , depression score n/a     Social History   Socioeconomic History  . Marital status: Married    Spouse name: Not on file  . Number of children: Not on file  . Years of education: Not on file  . Highest education level: Not on file  Occupational History  . Not on file  Social Needs  . Financial resource strain: Not on file  . Food insecurity:    Worry: Not on file    Inability: Not on file  . Transportation needs:    Medical: Not on file    Non-medical: Not on file  Tobacco Use  . Smoking status: Never Smoker  . Smokeless tobacco: Never Used  Substance and Sexual Activity  . Alcohol use: Yes    Comment: occasional   . Drug use: No  . Sexual activity: Yes    Birth control/protection: None    Comment: Ablation  Lifestyle  . Physical activity:    Days per week: Not on file    Minutes per session: Not on file  . Stress: Not on file  Relationships  . Social connections:    Talks on phone: Not on file    Gets together: Not on file    Attends religious service: Not on file    Active member of club or organization: Not on file    Attends meetings of clubs or organizations: Not on file    Relationship status: Not on file  . Intimate partner violence:    Fear of current or ex partner: Not on file    Emotionally abused: Not on file    Physically abused: Not on file    Forced sexual activity: Not on file  Other Topics Concern  . Not on  file  Social History Narrative  . Not on file    Family History  Problem Relation Age of Onset  . Cancer Father   . Cancer Sister   . Parkinsonism Maternal Grandmother   . Parkinsonism Maternal Grandfather   . Cancer Paternal Grandmother     Past Medical History:  Diagnosis Date  . Barrett esophagus   . Cancer ([place])    basal cell on scalp   . Edema   .  GERD (gastroesophageal reflux disease)    takes nexium    . Hypertension     Past Surgical History:  Procedure Laterality Date  . ABDOMINAL HYSTERECTOMY    . CESAREAN SECTION     c/s times two   . LAPAROSCOPIC VAGINAL HYSTERECTOMY WITH SALPINGECTOMY Bilateral 04/12/2015   Procedure: LAPAROSCOPIC ASSISTED VAGINAL HYSTERECTOMY WITH SALPINGECTOMY;  Surgeon: Eldred Manges, MD;  Location: [facility];  Service: Gynecology;  Laterality: Bilateral;  . NASAL SEPTUM SURGERY      Current Outpatient Medications  Medication Sig Dispense Refill  . atorvastatin (LIPITOR) 10 MG tablet Take 10 mg by mouth daily.    . Calcium Carb-Cholecalciferol (CALCIUM + D3) 600-200 MG-UNIT TABS Take 1 tablet by mouth daily.    . chlorthalidone (HYGROTON) 25 MG tablet Take 25 mg by mouth daily.     Marland Kitchen esomeprazole (NEXIUM) 20 MG capsule Take 20 mg by mouth daily at 12 noon.    . Multiple Vitamin (MULTIVITAMIN WITH MINERALS) TABS tablet Take 1 tablet by mouth daily.    . nebivolol (BYSTOLIC) 5 MG tablet Take 5 mg by mouth daily.     No current facility-administered medications for this visit.     Allergies as of 03/25/2018 - Review Complete 04/26/2017  Allergen Reaction Noted  . Tape Other (See Comments) 04/08/2015    Vitals: There were no vitals taken for this visit. Last Weight:  Wt Readings from Last 1 Encounters:  04/26/17 245 lb (111.1 kg)   TIW:PYKDX is no height or weight on file to calculate BMI.     Last Height:   Ht Readings from Last 1 Encounters:  04/26/17 5\' 10"  (1.778 m)    Physical exam:  General: The patient is awake,  alert and appears not in acute distress. The patient is well groomed. Head: Normocephalic, atraumatic. Neck is supple. Mallampati 2  neck circumference:17. Nasal airflow patent .  Cardiovascular:  Regular rate and rhythm, without  murmurs or carotid bruit, and without distended neck veins. Respiratory: Lungs are clear to auscultation. Skin:  Ankle edema, bilaterally, pitting edema- slow capillary refill. Fingers and wrists are puffy- wedding ring sits very tight.  Trunk: BMI is 36.3. The patient's posture is erect-   Neurologic exam : The patient is awake and alert, oriented to place and time.   Cranial nerves: taste and smell are unchanged -Pupils are equal and briskly reactive to light.  Reports blurred vision. Visual fields by finger perimetry are intact.Hearing to finger rub intact. Facial sensation intact to fine touch.Facial motor strength is symmetric and tongue and uvula move midline. Shoulder shrug was symmetrical.  Motor exam:  Normal tone, muscle bulk and symmetric strength in all extremities. Sensory:  Fine touch, pinprick and vibration were normal. She reports joint swelling and pain.  Deep tendon reflexes: in the  upper and lower extremities are symmetric and intact.  Assessment:  After physical and neurologic examination, review of laboratory studies,  Personal review of imaging studies, reports of other /same  Imaging studies, results of polysomnography and / or neurophysiology testing and pre-existing records as far as provided in visit., my assessment is   1) OSA , moderate - severe by HST AHI of 30/ hr, now controlled on auto CPAP, Epworth from 15 points pre CPAP reduced to 4 points.  the auto- titration capable CPAP is set between 5 and 15 cmH2O Pressure ,with 3 cm EPR, provided by [facility].    She remains 100% compliance as she was last year in  the same time.  Average use of time is 7 hours and 22 minutes for this patient, the residual AHI is 1.8 apneas per hour, the 95th  percentile pressure is 11.6 cm water right within the pressure window and she does have very very minor air leaks.    No changes to the settings or interface have to be made and the patient states she is a very happy CPAP user sleeping better feeling well neither reporting excessive fatigue more sleepiness the fatigue severity score was today endorsed at 11 out of 63 possible points and the Epworth sleepiness score at 4 out of possible 24 points.  The patient can follow yearly alternating between my nurse practitioner.     I spent more than 25 minutes of face to face time with the patient.Greater than 50% of time was spent in counseling and coordination of care. We have discussed the diagnosis and differential and I answered the patient's questions.  RV in 12 month with NP.     [name], MD 30/01/4075, 8:08 PM  Certified in Neurology by ABPN Certified in [place] by [facility] [address], [place], [place] [address]

## 2018-03-25 NOTE — Addendum Note (Signed)
Addended by: [name] on: 03/25/2018 03:52 PM   Modules accepted: Orders

## 2018-06-05 ENCOUNTER — Other Ambulatory Visit: Payer: Self-pay | Admitting: Internal Medicine

## 2018-06-05 DIAGNOSIS — Z1231 Encounter for screening mammogram for malignant neoplasm of breast: Secondary | ICD-10-CM

## 2018-06-06 ENCOUNTER — Encounter (INDEPENDENT_AMBULATORY_CARE_PROVIDER_SITE_OTHER): Payer: Managed Care, Other (non HMO)

## 2018-06-12 ENCOUNTER — Encounter (INDEPENDENT_AMBULATORY_CARE_PROVIDER_SITE_OTHER): Payer: Self-pay | Admitting: Family Medicine

## 2018-06-12 ENCOUNTER — Ambulatory Visit (INDEPENDENT_AMBULATORY_CARE_PROVIDER_SITE_OTHER): Payer: Managed Care, Other (non HMO) | Admitting: Family Medicine

## 2018-06-12 VITALS — BP 111/71 | HR 73 | Temp 97.7°F | Ht 70.0 in | Wt 258.0 lb

## 2018-06-12 DIAGNOSIS — Z6837 Body mass index (BMI) 37.0-37.9, adult: Secondary | ICD-10-CM

## 2018-06-12 DIAGNOSIS — Z9189 Other specified personal risk factors, not elsewhere classified: Secondary | ICD-10-CM

## 2018-06-12 DIAGNOSIS — Z0289 Encounter for other administrative examinations: Secondary | ICD-10-CM

## 2018-06-12 DIAGNOSIS — R5383 Other fatigue: Secondary | ICD-10-CM | POA: Diagnosis not present

## 2018-06-12 DIAGNOSIS — R0602 Shortness of breath: Secondary | ICD-10-CM

## 2018-06-12 DIAGNOSIS — E559 Vitamin D deficiency, unspecified: Secondary | ICD-10-CM

## 2018-06-12 DIAGNOSIS — Z1331 Encounter for screening for depression: Secondary | ICD-10-CM

## 2018-06-12 DIAGNOSIS — I10 Essential (primary) hypertension: Secondary | ICD-10-CM | POA: Diagnosis not present

## 2018-06-12 DIAGNOSIS — E66812 Obesity, class 2: Secondary | ICD-10-CM

## 2018-06-13 LAB — CBC WITH DIFFERENTIAL
BASOS ABS: 0.1 10*3/uL (ref 0.0–0.2)
Basos: 1 %
EOS (ABSOLUTE): 0.3 10*3/uL (ref 0.0–0.4)
Eos: 4 %
HEMOGLOBIN: 13.4 g/dL (ref 11.1–15.9)
Hematocrit: 39.3 % (ref 34.0–46.6)
IMMATURE GRANULOCYTES: 1 %
Immature Grans (Abs): 0 10*3/uL (ref 0.0–0.1)
LYMPHS: 31 %
Lymphocytes Absolute: 2.7 10*3/uL (ref 0.7–3.1)
MCH: 29.9 pg (ref 26.6–33.0)
MCHC: 34.1 g/dL (ref 31.5–35.7)
MCV: 88 fL (ref 79–97)
Monocytes Absolute: 0.5 10*3/uL (ref 0.1–0.9)
Monocytes: 6 %
NEUTROS ABS: 5.2 10*3/uL (ref 1.4–7.0)
Neutrophils: 57 %
RBC: 4.48 x10E6/uL (ref 3.77–5.28)
RDW: 12.3 % (ref 11.7–15.4)
WBC: 8.9 10*3/uL (ref 3.4–10.8)

## 2018-06-13 LAB — LIPID PANEL WITH LDL/HDL RATIO
CHOLESTEROL TOTAL: 181 mg/dL (ref 100–199)
HDL: 50 mg/dL (ref >39)
LDL Calculated: 102 mg/dL — ABNORMAL HIGH (ref 0–99)
LDl/HDL Ratio: 2 ratio (ref 0.0–3.2)
Triglycerides: 145 mg/dL (ref 0–149)
VLDL CHOLESTEROL CAL: 29 mg/dL (ref 5–40)

## 2018-06-13 LAB — INSULIN, RANDOM: INSULIN: 19.6 u[IU]/mL (ref 2.6–24.9)

## 2018-06-13 LAB — COMPREHENSIVE METABOLIC PANEL
ALBUMIN: 4.4 g/dL (ref 3.8–4.9)
ALT: 48 IU/L — AB (ref 0–32)
AST: 35 IU/L (ref 0–40)
Albumin/Globulin Ratio: 1.6 (ref 1.2–2.2)
Alkaline Phosphatase: 107 IU/L (ref 39–117)
BUN / CREAT RATIO: 15 (ref 9–23)
BUN: 14 mg/dL (ref 6–24)
Bilirubin Total: 0.4 mg/dL (ref 0.0–1.2)
CALCIUM: 9.8 mg/dL (ref 8.7–10.2)
CO2: 22 mmol/L (ref 20–29)
CREATININE: 0.92 mg/dL (ref 0.57–1.00)
Chloride: 100 mmol/L (ref 96–106)
GFR calc non Af Amer: 70 mL/min/{1.73_m2} (ref >59)
GFR, EST AFRICAN AMERICAN: 80 mL/min/{1.73_m2} (ref >59)
GLUCOSE: 88 mg/dL (ref 65–99)
Globulin, Total: 2.8 g/dL (ref 1.5–4.5)
Potassium: 4.2 mmol/L (ref 3.5–5.2)
Sodium: 142 mmol/L (ref 134–144)
TOTAL PROTEIN: 7.2 g/dL (ref 6.0–8.5)

## 2018-06-13 LAB — T4, FREE: FREE T4: 1.07 ng/dL (ref 0.82–1.77)

## 2018-06-13 LAB — HEMOGLOBIN A1C
ESTIMATED AVERAGE GLUCOSE: 111 mg/dL
Hgb A1c MFr Bld: 5.5 % (ref 4.8–5.6)

## 2018-06-13 LAB — VITAMIN D 25 HYDROXY (VIT D DEFICIENCY, FRACTURES): VIT D 25 HYDROXY: 43.6 ng/mL (ref 30.0–100.0)

## 2018-06-13 LAB — FOLATE: Folate: >20 ng/mL (ref >3.0)

## 2018-06-13 LAB — VITAMIN B12: Vitamin B-12: 761 pg/mL (ref 232–1245)

## 2018-06-13 LAB — T3: T3, Total: 98 ng/dL (ref 71–180)

## 2018-06-13 LAB — TSH: TSH: 3.95 u[IU]/mL (ref 0.450–4.500)

## 2018-06-13 NOTE — Progress Notes (Signed)
Office: [phone]  /  Fax: [phone]   Dear Dr. Brett Fairy,   Thank you for referring [name] to our clinic. The following note includes my evaluation and treatment recommendations.  HPI:   Chief Complaint: OBESITY    [name] has been referred by [name], MD for consultation regarding her obesity and obesity related comorbidities.    [name] (MR# [id number]) is a 58 y.o. female who presents on 06/12/2018 for obesity evaluation and treatment. Current BMI is Body mass index is 37.02 kg/m.[name] [name] has been struggling with her weight for many years and has been unsuccessful in either losing weight, maintaining weight loss, or reaching her healthy weight goal.     [name] attended our information session and states she is currently in the action stage of change and ready to dedicate time achieving and maintaining a healthier weight. [name] is interested in becoming our patient and working on intensive lifestyle modifications including (but not limited to) diet, exercise and weight loss.    [name] states her family eats meals together she thinks her family will eat healthier with  her her desired weight loss is 63 lbs she started gaining weight at age 40 her heaviest weight ever was 260 lbs she is a picky eater and doesn't like to eat healthier foods  she has significant food cravings issues  she snacks frequently in the evenings she is frequently drinking liquids with calories she frequently makes poor food choices she frequently eats larger portions than normal  she struggles with emotional eating    Fatigue [name] feels her energy is lower than it should be. This has worsened with weight gain and has not worsened recently. [name] admits to daytime somnolence and  admits to waking up still tired. Patient has a history of obstructive sleep apnea with the use of CPAP. Patent has a history of symptoms of daytime fatigue. Patient generally gets 6 hours of  sleep per night, and states they generally have generally restful sleep. Snoring is present. Apneic episodes are not present. Epworth Sleepiness Score is 3.  Dyspnea on exertion [name] notes increasing shortness of breath with exercising and seems to be worsening over time with weight gain. She notes getting out of breath sooner with activity than she used to. This has not gotten worse recently. EKG-Normal sinus rhythm with poor R wave progression. [name] denies orthopnea.  Hypertension [name] is a 58 y.o. female with hypertension. [name]'s blood pressure is controlled today. She denies chest pain, chest pressure, or headaches. She is on Bystolic and Olmesartan. She is working weight loss to help control her blood pressure with the goal of decreasing her risk of heart attack and stroke.   At risk for cardiovascular disease [name] is at a higher than average risk for cardiovascular disease due to obesity and hypertension. She currently denies any chest pain.  Vitamin D Deficiency [name] has a diagnosis of vitamin D deficiency. She is currently taking Vit D and diagnosis id likely given obesity. She denies nausea, vomiting or muscle weakness.  Depression Screen [name]'s Food and Mood (modified PHQ-9) score was  Depression screen PHQ 2/9 06/12/2018  Decreased Interest 2  Down, Depressed, Hopeless 1  PHQ - 2 Score 3  Altered sleeping 0  Tired, decreased energy 3  Change in appetite 2  Feeling bad or failure about yourself  2  Trouble concentrating 0  Moving slowly or fidgety/restless 0  Suicidal thoughts 0  PHQ-9 Score 10  Difficult  doing work/chores Not difficult at all    ASSESSMENT AND PLAN:  Other fatigue - Plan: EKG 12-Lead, Vitamin B12, CBC With Differential, Comprehensive metabolic panel, Folate, Hemoglobin A1c, Insulin, random, T3, T4, free, TSH  Shortness of breath on exertion - Plan: Lipid Panel With LDL/HDL Ratio  Essential hypertension  Vitamin D deficiency -  Plan: VITAMIN D 25 Hydroxy (Vit-D Deficiency, Fractures)  Depression screening  At risk for heart disease  Class 2 severe obesity with serious comorbidity and body mass index (BMI) of 37.0 to 37.9 in adult, unspecified obesity type (HCC)  PLAN:  Fatigue [name] was informed that her fatigue may be related to obesity, depression or many other causes. Labs will be ordered, and in the meanwhile [name] has agreed to work on diet, exercise and weight loss to help with fatigue. Proper sleep hygiene was discussed including the need for 7-8 hours of quality sleep each night. A sleep study was not ordered based on symptoms and Epworth score.  Dyspnea on exertion [name]'s shortness of breath appears to be obesity related and exercise induced. She has agreed to work on weight loss and gradually increase exercise to treat her exercise induced shortness of breath. If [name] follows our instructions and loses weight without improvement of her shortness of breath, we will plan to refer to pulmonology. We will monitor this condition regularly. [name] agrees to this plan.  Hypertension We discussed sodium restriction, working on healthy weight loss, and a regular exercise program as the means to achieve improved blood pressure control. [name] agreed with this plan and agreed to follow up as directed. We will continue to monitor her blood pressure as well as her progress with the above lifestyle modifications. [name] will continue her medications as prescribed and will watch for signs of hypotension as she continues her lifestyle modifications. EKG was done, and we will check CMP and FLP today. [name] agrees to follow up with our clinic in 2 weeks.  Cardiovascular risk counselling [name] was given extended (15 minutes) coronary artery disease prevention counseling today. She is 58 y.o. female and has risk factors for heart disease including obesity and hypertension. We discussed intensive lifestyle modifications  today with an emphasis on specific weight loss instructions and strategies. Pt was also informed of the importance of increasing exercise and decreasing saturated fats to help prevent heart disease.  Vitamin D Deficiency [name] was informed that low vitamin D levels contributes to fatigue and are associated with obesity, breast, and colon cancer. She will follow up for routine testing of vitamin D, at least 2-3 times per year. She was informed of the risk of over-replacement of vitamin D and agrees to not increase her dose unless she discusses this with [name] first. We will check Vit D level today. [name] agrees to follow up with our clinic in 2 weeks.  Depression Screen [name] had a moderately positive depression screening. Depression is commonly associated with obesity and often results in emotional eating behaviors. We will monitor this closely and work on CBT to help improve the non-hunger eating patterns. Referral to Psychology may be required if no improvement is seen as she continues in our clinic.  Obesity [name] is currently in the action stage of change and her goal is to continue with weight loss efforts. I recommend Desera begin the structured treatment plan as follows:  She has agreed to follow the Category 2 plan + 8 oz of Fairlife milk and 1/2 cup of 2% cottage cheese Yolande has been instructed  to eventually work up to a goal of 150 minutes of combined cardio and strengthening exercise per week for weight loss and overall health benefits. We discussed the following Behavioral Modification Strategies today: increasing lean protein intake, increasing vegetables, work on meal planning and easy cooking plans, better snacking choices, and planning for success   She was informed of the importance of frequent follow up visits to maximize her success with intensive lifestyle modifications for her multiple health conditions. She was informed we would discuss her lab results at her next visit  unless there is a critical issue that needs to be addressed sooner. [name] agreed to keep her next visit at the agreed upon time to discuss these results.  ALLERGIES: Allergies  Allergen Reactions  . Tape Other (See Comments)    Blisters and makes her skin peel    . Ciprofloxacin Nausea Only    MEDICATIONS: Current Outpatient Medications on File Prior to Visit  Medication Sig Dispense Refill  . atorvastatin (LIPITOR) 10 MG tablet Take 10 mg by mouth daily.    . Calcium Carb-Cholecalciferol (CALCIUM + D3) 600-200 MG-UNIT TABS Take 1 tablet by mouth daily.    . cholecalciferol (VITAMIN D3) 25 MCG (1000 UT) tablet Take 1,000 Units by mouth daily.    [name] esomeprazole (NEXIUM) 20 MG capsule Take 20 mg by mouth daily at 12 noon.    . furosemide (LASIX) 40 MG tablet Take 40 mg by mouth daily.  3  . Multiple Vitamin (MULTIVITAMIN WITH MINERALS) TABS tablet Take 1 tablet by mouth daily.    . nebivolol (BYSTOLIC) 5 MG tablet Take 5 mg by mouth daily.    [name] olmesartan (BENICAR) 20 MG tablet TAKE 1 (ONE) TABLET DAILY  5   No current facility-administered medications on file prior to visit.     PAST MEDICAL HISTORY: Past Medical History:  Diagnosis Date  . Barrett esophagus   . Cancer ([place])    basal cell on scalp   . Edema   . GERD (gastroesophageal reflux disease)    takes nexium    . Hyperlipidemia   . Hypertension   . Sleep apnea   . Swelling     PAST SURGICAL HISTORY: Past Surgical History:  Procedure Laterality Date  . ABDOMINAL HYSTERECTOMY    . BASAL CELL CARCINOMA EXCISION  2016  . CESAREAN SECTION     c/s times two   . LAPAROSCOPIC VAGINAL HYSTERECTOMY WITH SALPINGECTOMY Bilateral 04/12/2015   Procedure: LAPAROSCOPIC ASSISTED VAGINAL HYSTERECTOMY WITH SALPINGECTOMY;  Surgeon: Eldred Manges, MD;  Location: [facility];  Service: Gynecology;  Laterality: Bilateral;  . NASAL SEPTUM SURGERY      SOCIAL HISTORY: Social History   Tobacco Use  . Smoking status: Never Smoker    . Smokeless tobacco: Never Used  Substance Use Topics  . Alcohol use: Yes    Comment: occasional   . Drug use: No    FAMILY HISTORY: Family History  Problem Relation Age of Onset  . Hypertension Mother   . Hyperlipidemia Mother   . Heart disease Mother   . Thyroid disease Mother   . Obesity Mother   . Cancer Father   . Stroke Father   . Obesity Father   . Cancer Sister   . Parkinsonism Maternal Grandmother   . Parkinsonism Maternal Grandfather   . Cancer Paternal Grandmother     ROS: Review of Systems  Constitutional: Positive for malaise/fatigue. Negative for weight loss.  Eyes:       +  Wear glasses or contacts  Respiratory: Positive for shortness of breath (with exertion).   Cardiovascular: Negative for chest pain and orthopnea.       Negative chest pressure  Gastrointestinal: Negative for nausea and vomiting.  Musculoskeletal:       Negative muscle weakness  Skin:       + Dryness  Neurological: Negative for headaches.    PHYSICAL EXAM: Blood pressure 111/71, pulse 73, temperature 97.7 F (36.5 C), temperature source Oral, height 5\' 10"  (1.778 m), weight 258 lb (117 kg), SpO2 97 %. Body mass index is 37.02 kg/m. Physical Exam Vitals signs reviewed.  Constitutional:      Appearance: Normal appearance. She is obese.  HENT:     Head: Normocephalic and atraumatic.     Nose: Nose normal.  Eyes:     General: No scleral icterus.    Extraocular Movements: Extraocular movements intact.  Neck:     Musculoskeletal: Normal range of motion and neck supple.     Comments: No thyromegaly present Cardiovascular:     Rate and Rhythm: Normal rate and regular rhythm.     Pulses: Normal pulses.     Heart sounds: Normal heart sounds.  Pulmonary:     Effort: Pulmonary effort is normal. No respiratory distress.     Breath sounds: Normal breath sounds.  Abdominal:     Palpations: Abdomen is soft.     Tenderness: There is no abdominal tenderness.     Comments: + Obesity   Musculoskeletal: Normal range of motion.     Right lower leg: No edema.     Left lower leg: No edema.  Skin:    General: Skin is warm and dry.  Neurological:     Mental Status: She is alert and oriented to person, place, and time.     Coordination: Coordination normal.  Psychiatric:        Mood and Affect: Mood normal.        Behavior: Behavior normal.     RECENT LABS AND TESTS: BMET    Component Value Date/Time   NA 142 06/12/2018 1314   K 4.2 06/12/2018 1314   CL 100 06/12/2018 1314   CO2 22 06/12/2018 1314   GLUCOSE 88 06/12/2018 1314   GLUCOSE 98 04/12/2015 0835   BUN 14 06/12/2018 1314   CREATININE 0.92 06/12/2018 1314   CALCIUM 9.8 06/12/2018 1314   GFRNONAA 70 06/12/2018 1314   GFRAA 80 06/12/2018 1314   Lab Results  Component Value Date   HGBA1C 5.5 06/12/2018   Lab Results  Component Value Date   INSULIN 19.6 06/12/2018   CBC    Component Value Date/Time   WBC 8.9 06/12/2018 1314   WBC 15.5 (H) 04/13/2015 0533   RBC 4.48 06/12/2018 1314   RBC 3.50 (L) 04/13/2015 0533   HGB 13.4 06/12/2018 1314   HCT 39.3 06/12/2018 1314   PLT 261 04/13/2015 0533   MCV 88 06/12/2018 1314   MCH 29.9 06/12/2018 1314   MCH 30.6 04/13/2015 0533   MCHC 34.1 06/12/2018 1314   MCHC 34.1 04/13/2015 0533   RDW 12.3 06/12/2018 1314   LYMPHSABS 2.7 06/12/2018 1314   EOSABS 0.3 06/12/2018 1314   BASOSABS 0.1 06/12/2018 1314   Iron/TIBC/Ferritin/ %Sat No results found for: IRON, TIBC, FERRITIN, IRONPCTSAT Lipid Panel     Component Value Date/Time   CHOL 181 06/12/2018 1314   TRIG 145 06/12/2018 1314   HDL 50 06/12/2018 1314   LDLCALC 102 (H) 06/12/2018 1314  Hepatic Function Panel     Component Value Date/Time   PROT 7.2 06/12/2018 1314   ALBUMIN 4.4 06/12/2018 1314   AST 35 06/12/2018 1314   ALT 48 (H) 06/12/2018 1314   ALKPHOS 107 06/12/2018 1314   BILITOT 0.4 06/12/2018 1314      Component Value Date/Time   TSH 3.950 06/12/2018 1314   TSH 1.806  04/13/2015 0623    ECG  shows NSR with a rate of 72 BPM INDIRECT CALORIMETER done today shows a VO2 of 234 and a REE of 1628.  Her calculated basal metabolic rate is 5885 thus her basal metabolic rate is worse than expected.       OBESITY BEHAVIORAL INTERVENTION VISIT  Today's visit was # 1   Starting weight: 258 lbs Starting date: 06/12/2018 Today's weight : 258 lbs  Today's date: 06/12/2018 Total lbs lost to date: 0    ASK: We discussed the diagnosis of obesity with [name] today and Katharine Look agreed to give [name] permission to discuss obesity behavioral modification therapy today.  ASSESS: Sonji has the diagnosis of obesity and her BMI today is 37.02 Sally-Ann is in the action stage of change   ADVISE: Rosabel was educated on the multiple health risks of obesity as well as the benefit of weight loss to improve her health. She was advised of the need for long term treatment and the importance of lifestyle modifications to improve her current health and to decrease her risk of future health problems.  AGREE: Multiple dietary modification options and treatment options were discussed and  Ashaki agreed to follow the recommendations documented in the above note.  ARRANGE: Pari was educated on the importance of frequent visits to treat obesity as outlined per CMS and USPSTF guidelines and agreed to schedule her next follow up appointment today.  I, Trixie Dredge, am acting as transcriptionist for Ilene Qua, MD   I have reviewed the above documentation for accuracy and completeness, and I agree with the above. - Ilene Qua, MD

## 2018-06-26 ENCOUNTER — Ambulatory Visit (INDEPENDENT_AMBULATORY_CARE_PROVIDER_SITE_OTHER): Payer: Managed Care, Other (non HMO) | Admitting: Family Medicine

## 2018-06-26 ENCOUNTER — Encounter (INDEPENDENT_AMBULATORY_CARE_PROVIDER_SITE_OTHER): Payer: Self-pay | Admitting: Family Medicine

## 2018-06-26 VITALS — BP 113/73 | HR 65 | Temp 97.7°F | Ht 70.0 in | Wt 252.0 lb

## 2018-06-26 DIAGNOSIS — Z9189 Other specified personal risk factors, not elsewhere classified: Secondary | ICD-10-CM

## 2018-06-26 DIAGNOSIS — E88819 Insulin resistance, unspecified: Secondary | ICD-10-CM

## 2018-06-26 DIAGNOSIS — E559 Vitamin D deficiency, unspecified: Secondary | ICD-10-CM | POA: Diagnosis not present

## 2018-06-26 DIAGNOSIS — E7849 Other hyperlipidemia: Secondary | ICD-10-CM | POA: Diagnosis not present

## 2018-06-26 DIAGNOSIS — E8881 Metabolic syndrome: Secondary | ICD-10-CM | POA: Diagnosis not present

## 2018-06-26 DIAGNOSIS — Z6836 Body mass index (BMI) 36.0-36.9, adult: Secondary | ICD-10-CM

## 2018-06-27 NOTE — Progress Notes (Signed)
Office: [phone]  /  Fax: [phone]   HPI:   Chief Complaint: OBESITY [name] is here to discuss her progress with her obesity treatment plan. She is on the Category 2 plan + 8 oz of Fairlife milk and 1/2 cup of 2% cottage cheese and is following her eating plan approximately 95 % of the time. She states she is exercising 0 minutes 0 times per week. [name] had the Flu last week and couldn't eat as much as the meal plan outlined. She denies hunger when following the plan. She is gravitating toward sweets for snacks. She is missing oatmeal cereal for breakfast.  Her weight is 252 lb (114.3 kg) today and has had a weight loss of 6 pounds over a period of 2 weeks since her last visit. She has lost 6 lbs since starting treatment with [name].  Hyperlipidemia [name] has hyperlipidemia and has been trying to improve her cholesterol levels with intensive lifestyle modification including a low saturated fat diet, exercise and weight loss. Her LDL is slightly elevated and she is on statin. She denies any chest pain, claudication or myalgias.  Vitamin D Deficiency [name] has a diagnosis of vitamin D deficiency. She is on Vit D 1,000 IU daily. She notes fatigue and denies nausea, vomiting or muscle weakness.  At risk for osteopenia and osteoporosis Cecilia is at higher risk of osteopenia and osteoporosis due to vitamin D deficiency.   Insulin Resistance [name] has a diagnosis of insulin resistance based on her elevated fasting insulin level >5. Insulin of 19.6 and Hgb A1c of 5.5. Although [name]'s blood glucose readings are still under good control, insulin resistance puts her at greater risk of metabolic syndrome and diabetes. She is not taking metformin currently and continues to work on diet and exercise to decrease risk of diabetes.  ASSESSMENT AND PLAN:  Other hyperlipidemia  Vitamin D deficiency  Insulin resistance  At risk for osteoporosis  Class 2 severe obesity with serious  comorbidity and body mass index (BMI) of 36.0 to 36.9 in adult, unspecified obesity type ([place])  PLAN:  Hyperlipidemia Lyndie was informed of the American Heart Association Guidelines emphasizing intensive lifestyle modifications as the first line treatment for hyperlipidemia. We discussed many lifestyle modifications today in depth, and Jasie will continue to work on decreasing saturated fats such as fatty red meat, butter and many fried foods. She will also increase vegetables and lean protein in her diet and continue to work on exercise and weight loss efforts. We will repeat labs in 3 months. Magie agrees to follow up with our clinic in 2 weeks.  Vitamin D Deficiency [name] was informed that low vitamin D levels contributes to fatigue and are associated with obesity, breast, and colon cancer. [name] agrees to increase Vit D to 5,000 IU daily and will follow up for routine testing of vitamin D, at least 2-3 times per year. She was informed of the risk of over-replacement of vitamin D and agrees to not increase her dose unless she discusses this with [name] first. Daveda agrees to follow up with our clinic in 2 weeks.  At risk for osteopenia and osteoporosis [name] was given extended (30 minutes) osteoporosis prevention counseling today. [name] is at risk for osteopenia and osteoporsis due to her vitamin D deficiency. She was encouraged to take her vitamin D and follow her higher calcium diet and increase strengthening exercise to help strengthen her bones and decrease her risk of osteopenia and osteoporosis.  Insulin Resistance [name] will continue to  work on weight loss, exercise, and decreasing simple carbohydrates in her diet to help decrease the risk of diabetes. We dicussed metformin including benefits and risks. She was informed that eating too many simple carbohydrates or too many calories at one sitting increases the likelihood of GI side effects. [name] declined metformin for now and  prescription was not written today. We will repeat Hgb A1c and insulin in 3 months. [name] agrees to follow up with our clinic in 2 weeks as directed to monitor her progress.  Obesity [name] is currently in the action stage of change. As such, her goal is to continue with weight loss efforts She has agreed to follow the Category 2 plan + 8 oz of Fairlife milk and 1/2 cup of 2% cottage cheese [name] has been instructed to work up to a goal of 150 minutes of combined cardio and strengthening exercise per week for weight loss and overall health benefits. We discussed the following Behavioral Modification Strategies today: increasing lean protein intake, increasing vegetables, work on meal planning and easy cooking plans, emotional eating strategies, better snacking choices, and planning for success   [name] has agreed to follow up with our clinic in 2 weeks. She was informed of the importance of frequent follow up visits to maximize her success with intensive lifestyle modifications for her multiple health conditions.  ALLERGIES: Allergies  Allergen Reactions  . Tape Other (See Comments)    Blisters and makes her skin peel    . Ciprofloxacin Nausea Only    MEDICATIONS: Current Outpatient Medications on File Prior to Visit  Medication Sig Dispense Refill  . atorvastatin (LIPITOR) 10 MG tablet Take 10 mg by mouth daily.    . Calcium Carb-Cholecalciferol (CALCIUM + D3) 600-200 MG-UNIT TABS Take 1 tablet by mouth daily.    . cholecalciferol (VITAMIN D3) 25 MCG (1000 UT) tablet Take 5,000 Units by mouth daily.     Marland Kitchen esomeprazole (NEXIUM) 20 MG capsule Take 20 mg by mouth daily at 12 noon.    . furosemide (LASIX) 40 MG tablet Take 40 mg by mouth daily.  3  . Multiple Vitamin (MULTIVITAMIN WITH MINERALS) TABS tablet Take 1 tablet by mouth daily.    . nebivolol (BYSTOLIC) 5 MG tablet Take 5 mg by mouth daily.    Marland Kitchen olmesartan (BENICAR) 20 MG tablet TAKE 1 (ONE) TABLET DAILY  5   No current  facility-administered medications on file prior to visit.     PAST MEDICAL HISTORY: Past Medical History:  Diagnosis Date  . Barrett esophagus   . Cancer ([place])    basal cell on scalp   . Edema   . GERD (gastroesophageal reflux disease)    takes nexium    . Hyperlipidemia   . Hypertension   . Sleep apnea   . Swelling     PAST SURGICAL HISTORY: Past Surgical History:  Procedure Laterality Date  . ABDOMINAL HYSTERECTOMY    . BASAL CELL CARCINOMA EXCISION  2016  . CESAREAN SECTION     c/s times two   . LAPAROSCOPIC VAGINAL HYSTERECTOMY WITH SALPINGECTOMY Bilateral 04/12/2015   Procedure: LAPAROSCOPIC ASSISTED VAGINAL HYSTERECTOMY WITH SALPINGECTOMY;  Surgeon: Eldred Manges, MD;  Location: [facility];  Service: Gynecology;  Laterality: Bilateral;  . NASAL SEPTUM SURGERY      SOCIAL HISTORY: Social History   Tobacco Use  . Smoking status: Never Smoker  . Smokeless tobacco: Never Used  Substance Use Topics  . Alcohol use: Yes    Comment: occasional   .  Drug use: No    FAMILY HISTORY: Family History  Problem Relation Age of Onset  . Hypertension Mother   . Hyperlipidemia Mother   . Heart disease Mother   . Thyroid disease Mother   . Obesity Mother   . Cancer Father   . Stroke Father   . Obesity Father   . Cancer Sister   . Parkinsonism Maternal Grandmother   . Parkinsonism Maternal Grandfather   . Cancer Paternal Grandmother     ROS: Review of Systems  Constitutional: Positive for malaise/fatigue and weight loss.  Cardiovascular: Negative for chest pain and claudication.  Gastrointestinal: Negative for nausea and vomiting.  Musculoskeletal: Negative for myalgias.       Negative muscle weakness    PHYSICAL EXAM: Blood pressure 113/73, pulse 65, temperature 97.7 F (36.5 C), temperature source Oral, height 5\' 10"  (1.778 m), weight 252 lb (114.3 kg), SpO2 96 %. Body mass index is 36.16 kg/m. Physical Exam Vitals signs reviewed.  Constitutional:       Appearance: Normal appearance. She is obese.  Cardiovascular:     Rate and Rhythm: Normal rate.     Pulses: Normal pulses.  Pulmonary:     Effort: Pulmonary effort is normal.     Breath sounds: Normal breath sounds.  Musculoskeletal: Normal range of motion.  Skin:    General: Skin is warm and dry.  Neurological:     Mental Status: She is alert and oriented to person, place, and time.  Psychiatric:        Mood and Affect: Mood normal.        Behavior: Behavior normal.     RECENT LABS AND TESTS: BMET    Component Value Date/Time   NA 142 06/12/2018 1314   K 4.2 06/12/2018 1314   CL 100 06/12/2018 1314   CO2 22 06/12/2018 1314   GLUCOSE 88 06/12/2018 1314   GLUCOSE 98 04/12/2015 0835   BUN 14 06/12/2018 1314   CREATININE 0.92 06/12/2018 1314   CALCIUM 9.8 06/12/2018 1314   GFRNONAA 70 06/12/2018 1314   GFRAA 80 06/12/2018 1314   Lab Results  Component Value Date   HGBA1C 5.5 06/12/2018   Lab Results  Component Value Date   INSULIN 19.6 06/12/2018   CBC    Component Value Date/Time   WBC 8.9 06/12/2018 1314   WBC 15.5 (H) 04/13/2015 0533   RBC 4.48 06/12/2018 1314   RBC 3.50 (L) 04/13/2015 0533   HGB 13.4 06/12/2018 1314   HCT 39.3 06/12/2018 1314   PLT 261 04/13/2015 0533   MCV 88 06/12/2018 1314   MCH 29.9 06/12/2018 1314   MCH 30.6 04/13/2015 0533   MCHC 34.1 06/12/2018 1314   MCHC 34.1 04/13/2015 0533   RDW 12.3 06/12/2018 1314   LYMPHSABS 2.7 06/12/2018 1314   EOSABS 0.3 06/12/2018 1314   BASOSABS 0.1 06/12/2018 1314   Iron/TIBC/Ferritin/ %Sat No results found for: IRON, TIBC, FERRITIN, IRONPCTSAT Lipid Panel     Component Value Date/Time   CHOL 181 06/12/2018 1314   TRIG 145 06/12/2018 1314   HDL 50 06/12/2018 1314   LDLCALC 102 (H) 06/12/2018 1314   Hepatic Function Panel     Component Value Date/Time   PROT 7.2 06/12/2018 1314   ALBUMIN 4.4 06/12/2018 1314   AST 35 06/12/2018 1314   ALT 48 (H) 06/12/2018 1314   ALKPHOS 107 06/12/2018  1314   BILITOT 0.4 06/12/2018 1314      Component Value Date/Time   TSH 3.950  06/12/2018 1314   TSH 1.806 04/13/2015 0623      OBESITY BEHAVIORAL INTERVENTION VISIT  Today's visit was # 2   Starting weight: 258 lbs Starting date: 06/12/2018 Today's weight : 252 lbs  Today's date: 06/26/2018 Total lbs lost to date: 6    ASK: We discussed the diagnosis of obesity with [name] today and Katharine Look agreed to give [name] permission to discuss obesity behavioral modification therapy today.  ASSESS: Ijeoma has the diagnosis of obesity and her BMI today is 36.16 Dilana is in the action stage of change   ADVISE: Shakedra was educated on the multiple health risks of obesity as well as the benefit of weight loss to improve her health. She was advised of the need for long term treatment and the importance of lifestyle modifications to improve her current health and to decrease her risk of future health problems.  AGREE: Multiple dietary modification options and treatment options were discussed and  Skyley agreed to follow the recommendations documented in the above note.  ARRANGE: Islam was educated on the importance of frequent visits to treat obesity as outlined per CMS and USPSTF guidelines and agreed to schedule her next follow up appointment today.  I, Trixie Dredge, am acting as transcriptionist for Ilene Qua, MD  I have reviewed the above documentation for accuracy and completeness, and I agree with the above. - Ilene Qua, MD

## 2018-07-10 ENCOUNTER — Encounter (INDEPENDENT_AMBULATORY_CARE_PROVIDER_SITE_OTHER): Payer: Self-pay | Admitting: Physician Assistant

## 2018-07-10 ENCOUNTER — Ambulatory Visit (INDEPENDENT_AMBULATORY_CARE_PROVIDER_SITE_OTHER): Payer: Managed Care, Other (non HMO) | Admitting: Physician Assistant

## 2018-07-10 VITALS — BP 108/69 | HR 66 | Temp 97.8°F | Ht 70.0 in | Wt 250.0 lb

## 2018-07-10 DIAGNOSIS — E559 Vitamin D deficiency, unspecified: Secondary | ICD-10-CM | POA: Diagnosis not present

## 2018-07-10 DIAGNOSIS — Z6835 Body mass index (BMI) 35.0-35.9, adult: Secondary | ICD-10-CM | POA: Diagnosis not present

## 2018-07-10 NOTE — Progress Notes (Signed)
Office: [phone]  /  Fax: [phone]   HPI:   Chief Complaint: OBESITY [name] is here to discuss her progress with her obesity treatment plan. She is on the Category 2 plan and is following her eating plan approximately 90% of the time. She states she is exercising 0 minutes 0 times per week. [name] did well with weight loss. She reports that she did not struggle with the plan at all over the last 2 weeks. Her weight is 250 lb (113.4 kg) today and has had a weight loss of 2 pounds over a period of 2 weeks since her last visit. She has lost 8 lbs since starting treatment with [name].  Vitamin D deficiency [name] has a diagnosis of Vitamin D deficiency. She is currently taking OTC Vit D and denies nausea, vomiting or muscle weakness.  ASSESSMENT AND PLAN:  Vitamin D deficiency  Class 2 severe obesity with serious comorbidity and body mass index (BMI) of 35.0 to 35.9 in adult, unspecified obesity type ([name])  PLAN:  Vitamin D Deficiency [name] was informed that low Vitamin D levels contributes to fatigue and are associated with obesity, breast, and colon cancer. She agrees to continue taking OTC Vit D and will follow-up for routine testing of Vitamin D, at least 2-3 times per year. She was informed of the risk of over-replacement of Vitamin D and agrees to not increase her dose unless she discusses this with [name] first. [name] agrees to follow-up with our clinic in 2 weeks.  I spent > than 50% of the 15 minute visit on counseling as documented in the note.  Obesity [name] is currently in the action stage of change. As such, her goal is to continue with weight loss efforts. She has agreed to follow the Category 2 plan. [name] has been instructed to work up to a goal of 150 minutes of combined cardio and strengthening exercise per week for weight loss and overall health benefits. We discussed the following Behavioral Modification Strategies today: work on meal planning, easy cooking  plans, and planning for success.  [name] has agreed to follow-up with our clinic in 2 weeks. She was informed of the importance of frequent follow up visits to maximize her success with intensive lifestyle modifications for her multiple health conditions.  ALLERGIES: Allergies  Allergen Reactions  . Tape Other (See Comments)    Blisters and makes her skin peel    . Ciprofloxacin Nausea Only    MEDICATIONS: Current Outpatient Medications on File Prior to Visit  Medication Sig Dispense Refill  . atorvastatin (LIPITOR) 10 MG tablet Take 10 mg by mouth daily.    . Calcium Carb-Cholecalciferol (CALCIUM + D3) 600-200 MG-UNIT TABS Take 1 tablet by mouth daily.    . cholecalciferol (VITAMIN D3) 25 MCG (1000 UT) tablet Take 5,000 Units by mouth daily.     Marland Kitchen esomeprazole (NEXIUM) 20 MG capsule Take 20 mg by mouth daily at 12 noon.    . furosemide (LASIX) 40 MG tablet Take 40 mg by mouth daily.  3  . Multiple Vitamin (MULTIVITAMIN WITH MINERALS) TABS tablet Take 1 tablet by mouth daily.    . nebivolol (BYSTOLIC) 5 MG tablet Take 5 mg by mouth daily.    Marland Kitchen olmesartan (BENICAR) 20 MG tablet TAKE 1 (ONE) TABLET DAILY  5   No current facility-administered medications on file prior to visit.     PAST MEDICAL HISTORY: Past Medical History:  Diagnosis Date  . Barrett esophagus   . Cancer ([place])  basal cell on scalp   . Edema   . GERD (gastroesophageal reflux disease)    takes nexium    . Hyperlipidemia   . Hypertension   . Sleep apnea   . Swelling     PAST SURGICAL HISTORY: Past Surgical History:  Procedure Laterality Date  . ABDOMINAL HYSTERECTOMY    . BASAL CELL CARCINOMA EXCISION  2016  . CESAREAN SECTION     c/s times two   . LAPAROSCOPIC VAGINAL HYSTERECTOMY WITH SALPINGECTOMY Bilateral 04/12/2015   Procedure: LAPAROSCOPIC ASSISTED VAGINAL HYSTERECTOMY WITH SALPINGECTOMY;  Surgeon: Eldred Manges, MD;  Location: [facility];  Service: Gynecology;  Laterality: Bilateral;  . NASAL  SEPTUM SURGERY      SOCIAL HISTORY: Social History   Tobacco Use  . Smoking status: Never Smoker  . Smokeless tobacco: Never Used  Substance Use Topics  . Alcohol use: Yes    Comment: occasional   . Drug use: No    FAMILY HISTORY: Family History  Problem Relation Age of Onset  . Hypertension Mother   . Hyperlipidemia Mother   . Heart disease Mother   . Thyroid disease Mother   . Obesity Mother   . Cancer Father   . Stroke Father   . Obesity Father   . Cancer Sister   . Parkinsonism Maternal Grandmother   . Parkinsonism Maternal Grandfather   . Cancer Paternal Grandmother     ROS: Review of Systems  Constitutional: Positive for weight loss.  Gastrointestinal: Negative for nausea and vomiting.  Musculoskeletal:       Negative for muscle weakness.  Endo/Heme/Allergies:       Negative for hypoglycemia.   PHYSICAL EXAM: Blood pressure 108/69, pulse 66, temperature 97.8 F (36.6 C), height 5\' 10"  (1.778 m), weight 250 lb (113.4 kg), SpO2 96 %. Body mass index is 35.87 kg/m. Physical Exam Vitals signs reviewed.  Constitutional:      Appearance: Normal appearance. She is obese.  Cardiovascular:     Rate and Rhythm: Normal rate.     Pulses: Normal pulses.  Pulmonary:     Effort: Pulmonary effort is normal.     Breath sounds: Normal breath sounds.  Musculoskeletal: Normal range of motion.  Skin:    General: Skin is warm and dry.  Neurological:     Mental Status: She is alert and oriented to person, place, and time.  Psychiatric:        Behavior: Behavior normal.   RECENT LABS AND TESTS: BMET    Component Value Date/Time   NA 142 06/12/2018 1314   K 4.2 06/12/2018 1314   CL 100 06/12/2018 1314   CO2 22 06/12/2018 1314   GLUCOSE 88 06/12/2018 1314   GLUCOSE 98 04/12/2015 0835   BUN 14 06/12/2018 1314   CREATININE 0.92 06/12/2018 1314   CALCIUM 9.8 06/12/2018 1314   GFRNONAA 70 06/12/2018 1314   GFRAA 80 06/12/2018 1314   Lab Results  Component  Value Date   HGBA1C 5.5 06/12/2018   Lab Results  Component Value Date   INSULIN 19.6 06/12/2018   CBC    Component Value Date/Time   WBC 8.9 06/12/2018 1314   WBC 15.5 (H) 04/13/2015 0533   RBC 4.48 06/12/2018 1314   RBC 3.50 (L) 04/13/2015 0533   HGB 13.4 06/12/2018 1314   HCT 39.3 06/12/2018 1314   PLT 261 04/13/2015 0533   MCV 88 06/12/2018 1314   MCH 29.9 06/12/2018 1314   MCH 30.6 04/13/2015 0533   MCHC  34.1 06/12/2018 1314   MCHC 34.1 04/13/2015 0533   RDW 12.3 06/12/2018 1314   LYMPHSABS 2.7 06/12/2018 1314   EOSABS 0.3 06/12/2018 1314   BASOSABS 0.1 06/12/2018 1314   Iron/TIBC/Ferritin/ %Sat No results found for: IRON, TIBC, FERRITIN, IRONPCTSAT Lipid Panel     Component Value Date/Time   CHOL 181 06/12/2018 1314   TRIG 145 06/12/2018 1314   HDL 50 06/12/2018 1314   LDLCALC 102 (H) 06/12/2018 1314   Hepatic Function Panel     Component Value Date/Time   PROT 7.2 06/12/2018 1314   ALBUMIN 4.4 06/12/2018 1314   AST 35 06/12/2018 1314   ALT 48 (H) 06/12/2018 1314   ALKPHOS 107 06/12/2018 1314   BILITOT 0.4 06/12/2018 1314      Component Value Date/Time   TSH 3.950 06/12/2018 1314   TSH 1.806 04/13/2015 0623    Ref. Range 06/12/2018 13:14  Vitamin D, 25-Hydroxy Latest Ref Range: 30.0 - 100.0 ng/mL 43.6   OBESITY BEHAVIORAL INTERVENTION VISIT  Today's visit was #3  Starting weight: 258 lbs Starting date: 06/12/2018 Today's weight: 250 lbs  Today's date: 07/10/2018 Total lbs lost to date: 8  ASK: We discussed the diagnosis of obesity with [name] today and [name] agreed to give [name] permission to discuss obesity behavioral modification therapy today.  ASSESS: Wetona has the diagnosis of obesity and her BMI today is 35.87. Luevenia is in the action stage of change.   ADVISE: Britanie was educated on the multiple health risks of obesity as well as the benefit of weight loss to improve her health. She was advised of the need for long term  treatment and the importance of lifestyle modifications to improve her current health and to decrease her risk of future health problems.  AGREE: Multiple dietary modification options and treatment options were discussed and  Emmarie agreed to follow the recommendations documented in the above note.  ARRANGE: Nikelle was educated on the importance of frequent visits to treat obesity as outlined per CMS and USPSTF guidelines and agreed to schedule her next follow up appointment today.  Migdalia Dk, am acting as transcriptionist for Abby Potash, PA-C I, Abby Potash, PA-C have reviewed above note and agree with its content

## 2018-07-11 ENCOUNTER — Ambulatory Visit (INDEPENDENT_AMBULATORY_CARE_PROVIDER_SITE_OTHER): Payer: Managed Care, Other (non HMO) | Admitting: Family Medicine

## 2018-07-15 ENCOUNTER — Ambulatory Visit
Admission: RE | Admit: 2018-07-15 | Discharge: 2018-07-15 | Disposition: A | Discharge status: Home / Self Care | Payer: Managed Care, Other (non HMO) | Source: Ambulatory Visit | Attending: Internal Medicine | Admitting: Internal Medicine

## 2018-07-15 DIAGNOSIS — Z1231 Encounter for screening mammogram for malignant neoplasm of breast: Secondary | ICD-10-CM

## 2018-07-24 ENCOUNTER — Ambulatory Visit (INDEPENDENT_AMBULATORY_CARE_PROVIDER_SITE_OTHER): Payer: Managed Care, Other (non HMO) | Admitting: Physician Assistant

## 2018-07-24 VITALS — BP 112/72 | HR 66 | Temp 97.9°F | Ht 70.0 in | Wt 246.0 lb

## 2018-07-24 DIAGNOSIS — Z6835 Body mass index (BMI) 35.0-35.9, adult: Secondary | ICD-10-CM | POA: Diagnosis not present

## 2018-07-24 DIAGNOSIS — E7849 Other hyperlipidemia: Secondary | ICD-10-CM

## 2018-07-24 NOTE — Progress Notes (Signed)
Office: [phone]  /  Fax: [phone]   HPI:   Chief Complaint: OBESITY [name] is here to discuss her progress with her obesity treatment plan. She is on the Category 2 plan and is following her eating plan approximately 95% of the time. She states she is exercising 0 minutes 0 times per week. Shiesha continues to do well with weight loss. She reports that she is following the plan closely and enjoying it. Her weight is 246 lb (111.6 kg) today and has had a weight loss of 4 pounds over a period of 2 weeks since her last visit. She has lost 12 lbs since starting treatment with [name].  Hyperlipidemia [name] has hyperlipidemia and is on atorvastatin. She has been trying to improve her cholesterol levels with intensive lifestyle modification including a low saturated fat diet, exercise and weight loss. She denies any chest pain.  ASSESSMENT AND PLAN:  Other hyperlipidemia  Class 2 severe obesity with serious comorbidity and body mass index (BMI) of 35.0 to 35.9 in adult, unspecified obesity type ([place])  PLAN:  Hyperlipidemia Diamante was informed of the American Heart Association Guidelines emphasizing intensive lifestyle modifications as the first line treatment for hyperlipidemia. We discussed many lifestyle modifications today in depth, and [name] will continue to work on decreasing saturated fats such as fatty red meat, butter and many fried foods. She will continue taking atorvastatin and will also increase vegetables and lean protein in her diet and continue to work on exercise and weight loss efforts.  Obesity [name] is currently in the action stage of change. As such, her goal is to continue with weight loss efforts. She has agreed to follow the Category 2 plan. [name] has been instructed to work up to a goal of 150 minutes of combined cardio and strengthening exercise per week for weight loss and overall health benefits. We discussed the following Behavioral Modification  Strategies today: work on meal planning and easy cooking plans and ways to avoid boredom eating.  [name] has agreed to follow-up with our clinic in 2 weeks. She was informed of the importance of frequent follow up visits to maximize her success with intensive lifestyle modifications for her multiple health conditions.  ALLERGIES: Allergies  Allergen Reactions  . Tape Other (See Comments)    Blisters and makes her skin peel    . Ciprofloxacin Nausea Only    MEDICATIONS: Current Outpatient Medications on File Prior to Visit  Medication Sig Dispense Refill  . atorvastatin (LIPITOR) 10 MG tablet Take 10 mg by mouth daily.    . Calcium Carb-Cholecalciferol (CALCIUM + D3) 600-200 MG-UNIT TABS Take 1 tablet by mouth daily.    . cholecalciferol (VITAMIN D3) 25 MCG (1000 UT) tablet Take 5,000 Units by mouth daily.     [name] esomeprazole (NEXIUM) 20 MG capsule Take 20 mg by mouth daily at 12 noon.    . furosemide (LASIX) 40 MG tablet Take 40 mg by mouth daily.  3  . Multiple Vitamin (MULTIVITAMIN WITH MINERALS) TABS tablet Take 1 tablet by mouth daily.    . nebivolol (BYSTOLIC) 5 MG tablet Take 5 mg by mouth daily.    [name] olmesartan (BENICAR) 20 MG tablet TAKE 1 (ONE) TABLET DAILY  5   No current facility-administered medications on file prior to visit.     PAST MEDICAL HISTORY: Past Medical History:  Diagnosis Date  . Barrett esophagus   . Cancer ([place])    basal cell on scalp   . Edema   .  GERD (gastroesophageal reflux disease)    takes nexium    . Hyperlipidemia   . Hypertension   . Sleep apnea   . Swelling     PAST SURGICAL HISTORY: Past Surgical History:  Procedure Laterality Date  . ABDOMINAL HYSTERECTOMY    . BASAL CELL CARCINOMA EXCISION  2016  . CESAREAN SECTION     c/s times two   . LAPAROSCOPIC VAGINAL HYSTERECTOMY WITH SALPINGECTOMY Bilateral 04/12/2015   Procedure: LAPAROSCOPIC ASSISTED VAGINAL HYSTERECTOMY WITH SALPINGECTOMY;  Surgeon: Eldred Manges, MD;  Location:  [facility];  Service: Gynecology;  Laterality: Bilateral;  . NASAL SEPTUM SURGERY      SOCIAL HISTORY: Social History   Tobacco Use  . Smoking status: Never Smoker  . Smokeless tobacco: Never Used  Substance Use Topics  . Alcohol use: Yes    Comment: occasional   . Drug use: No    FAMILY HISTORY: Family History  Problem Relation Age of Onset  . Hypertension Mother   . Hyperlipidemia Mother   . Heart disease Mother   . Thyroid disease Mother   . Obesity Mother   . Cancer Father   . Stroke Father   . Obesity Father   . Cancer Sister   . Parkinsonism Maternal Grandmother   . Parkinsonism Maternal Grandfather   . Cancer Paternal Grandmother   . Breast cancer Neg Hx    ROS: Review of Systems  Constitutional: Positive for weight loss.  Cardiovascular: Negative for chest pain.  Endo/Heme/Allergies:       Negative for hypoglycemia.   PHYSICAL EXAM: Blood pressure 112/72, pulse 66, temperature 97.9 F (36.6 C), temperature source Oral, height 5\' 10"  (1.778 m), weight 246 lb (111.6 kg), SpO2 97 %. Body mass index is 35.3 kg/m. Physical Exam Vitals signs reviewed.  Constitutional:      Appearance: Normal appearance. She is obese.  Cardiovascular:     Rate and Rhythm: Normal rate.     Pulses: Normal pulses.  Pulmonary:     Effort: Pulmonary effort is normal.     Breath sounds: Normal breath sounds.  Musculoskeletal: Normal range of motion.  Skin:    General: Skin is warm and dry.  Neurological:     Mental Status: She is alert and oriented to person, place, and time.  Psychiatric:        Behavior: Behavior normal.   RECENT LABS AND TESTS: BMET    Component Value Date/Time   NA 142 06/12/2018 1314   K 4.2 06/12/2018 1314   CL 100 06/12/2018 1314   CO2 22 06/12/2018 1314   GLUCOSE 88 06/12/2018 1314   GLUCOSE 98 04/12/2015 0835   BUN 14 06/12/2018 1314   CREATININE 0.92 06/12/2018 1314   CALCIUM 9.8 06/12/2018 1314   GFRNONAA 70 06/12/2018 1314   GFRAA 80  06/12/2018 1314   Lab Results  Component Value Date   HGBA1C 5.5 06/12/2018   Lab Results  Component Value Date   INSULIN 19.6 06/12/2018   CBC    Component Value Date/Time   WBC 8.9 06/12/2018 1314   WBC 15.5 (H) 04/13/2015 0533   RBC 4.48 06/12/2018 1314   RBC 3.50 (L) 04/13/2015 0533   HGB 13.4 06/12/2018 1314   HCT 39.3 06/12/2018 1314   PLT 261 04/13/2015 0533   MCV 88 06/12/2018 1314   MCH 29.9 06/12/2018 1314   MCH 30.6 04/13/2015 0533   MCHC 34.1 06/12/2018 1314   MCHC 34.1 04/13/2015 0533   RDW 12.3 06/12/2018 1314  LYMPHSABS 2.7 06/12/2018 1314   EOSABS 0.3 06/12/2018 1314   BASOSABS 0.1 06/12/2018 1314   Iron/TIBC/Ferritin/ %Sat No results found for: IRON, TIBC, FERRITIN, IRONPCTSAT Lipid Panel     Component Value Date/Time   CHOL 181 06/12/2018 1314   TRIG 145 06/12/2018 1314   HDL 50 06/12/2018 1314   LDLCALC 102 (H) 06/12/2018 1314   Hepatic Function Panel     Component Value Date/Time   PROT 7.2 06/12/2018 1314   ALBUMIN 4.4 06/12/2018 1314   AST 35 06/12/2018 1314   ALT 48 (H) 06/12/2018 1314   ALKPHOS 107 06/12/2018 1314   BILITOT 0.4 06/12/2018 1314      Component Value Date/Time   TSH 3.950 06/12/2018 1314   TSH 1.806 04/13/2015 0623    Ref. Range 06/12/2018 13:14  Vitamin D, 25-Hydroxy Latest Ref Range: 30.0 - 100.0 ng/mL 43.6   OBESITY BEHAVIORAL INTERVENTION VISIT  Today's visit was #4  Starting weight: 258 lbs Starting date: 06/12/2018 Today's weight: 246 lbs Today's date: 07/24/2018 Total lbs lost to date: 12    07/24/2018  Height 5\' 10"  (1.778 m)  Weight 246 lb (111.6 kg)  BMI (Calculated) 35.3  BLOOD PRESSURE - SYSTOLIC 572  BLOOD PRESSURE - DIASTOLIC 72   Body Fat % 62.0 %  Total Body Water (lbs) 93 lbs   ASK: We discussed the diagnosis of obesity with [name] today and Katharine Look agreed to give [name] permission to discuss obesity behavioral modification therapy today.  ASSESS: Donnetta has the diagnosis of  obesity and her BMI today is 35.3. Dossie is in the action stage of change.   ADVISE: Aida was educated on the multiple health risks of obesity as well as the benefit of weight loss to improve her health. She was advised of the need for long term treatment and the importance of lifestyle modifications to improve her current health and to decrease her risk of future health problems.  AGREE: Multiple dietary modification options and treatment options were discussed and  Khloee agreed to follow the recommendations documented in the above note.  ARRANGE: Aleigha was educated on the importance of frequent visits to treat obesity as outlined per CMS and USPSTF guidelines and agreed to schedule her next follow up appointment today.  Migdalia Dk, am acting as transcriptionist for Abby Potash, PA-C I, Abby Potash, PA-C have reviewed above note and agree with its content

## 2018-08-12 ENCOUNTER — Ambulatory Visit (INDEPENDENT_AMBULATORY_CARE_PROVIDER_SITE_OTHER): Payer: Managed Care, Other (non HMO) | Admitting: Physician Assistant

## 2018-08-15 ENCOUNTER — Encounter (INDEPENDENT_AMBULATORY_CARE_PROVIDER_SITE_OTHER): Payer: Self-pay

## 2018-09-02 ENCOUNTER — Other Ambulatory Visit: Payer: Self-pay

## 2018-09-02 ENCOUNTER — Encounter (INDEPENDENT_AMBULATORY_CARE_PROVIDER_SITE_OTHER): Payer: Self-pay | Admitting: Physician Assistant

## 2018-09-02 ENCOUNTER — Ambulatory Visit (INDEPENDENT_AMBULATORY_CARE_PROVIDER_SITE_OTHER): Payer: Managed Care, Other (non HMO) | Admitting: Physician Assistant

## 2018-09-02 DIAGNOSIS — E559 Vitamin D deficiency, unspecified: Secondary | ICD-10-CM

## 2018-09-02 DIAGNOSIS — Z6835 Body mass index (BMI) 35.0-35.9, adult: Secondary | ICD-10-CM

## 2018-09-02 NOTE — Progress Notes (Signed)
Office: [phone]  /  Fax: [phone] TeleHealth Visit:  [name] has verbally consented to this TeleHealth visit today. The patient is located at home, the provider is located at the [employer] [facility] office. The participants in this visit include the listed provider and patient. The visit was conducted today via FaceTime.  HPI:   Chief Complaint: OBESITY [name] is here to discuss her progress with her obesity treatment plan. She is on the Category 2 plan and is following her eating plan approximately 70% of the time. She states she is walking30 minutes 5 times per week. Media reports doing well with the plan. She is getting out and walking daily. She denies hunger. We were unable to weigh the patient today for this TeleHealth visit. She feels as if she has lost 6 lbs since her last visit. She has lost 12 lbs since starting treatment with [name].  Vitamin D deficiency [name] has a diagnosis of Vitamin D deficiency. She is currently taking OTC Vit D and denies nausea, vomiting or muscle weakness.  ASSESSMENT AND PLAN:  Vitamin D deficiency  Class 2 severe obesity with serious comorbidity and body mass index (BMI) of 35.0 to 35.9 in adult, unspecified obesity type ([place])  PLAN:  Vitamin D Deficiency [name] was informed that low Vitamin D levels contributes to fatigue and are associated with obesity, breast, and colon cancer. She agrees to continue taking OTC Vit D and will follow-up for routine testing of Vitamin D, at least 2-3 times per year. She was informed of the risk of over-replacement of Vitamin D and agrees to not increase her dose unless she discusses this with [name] first. [name] agrees to follow-up with our clinic in 2 weeks.  Obesity [name] is currently in the action stage of change. As such, her goal is to continue with weight loss efforts. She has agreed to follow the Category 2 plan + 8 oz. glass or 1/2 cup of cottage cheese. [name] has been instructed  to work up to a goal of 150 minutes of combined cardio and strengthening exercise per week for weight loss and overall health benefits. We discussed the following Behavioral Modification Strategies today: work on meal planning, easy cooking plans, and better snacking choices.  [name] has agreed to follow-up with our clinic in 2 weeks. She was informed of the importance of frequent follow-up visits to maximize her success with intensive lifestyle modifications for her multiple health conditions.  ALLERGIES: Allergies  Allergen Reactions  . Tape Other (See Comments)    Blisters and makes her skin peel    . Ciprofloxacin Nausea Only    MEDICATIONS: Current Outpatient Medications on File Prior to Visit  Medication Sig Dispense Refill  . atorvastatin (LIPITOR) 10 MG tablet Take 10 mg by mouth daily.    . Calcium Carb-Cholecalciferol (CALCIUM + D3) 600-200 MG-UNIT TABS Take 1 tablet by mouth daily.    . cholecalciferol (VITAMIN D3) 25 MCG (1000 UT) tablet Take 5,000 Units by mouth daily.     [name] Kitchen esomeprazole (NEXIUM) 20 MG capsule Take 20 mg by mouth daily at 12 noon.    . furosemide (LASIX) 40 MG tablet Take 40 mg by mouth daily.  3  . Multiple Vitamin (MULTIVITAMIN WITH MINERALS) TABS tablet Take 1 tablet by mouth daily.    . nebivolol (BYSTOLIC) 5 MG tablet Take 5 mg by mouth daily.    [name] Kitchen olmesartan (BENICAR) 20 MG tablet TAKE 1 (ONE) TABLET DAILY  5   No  current facility-administered medications on file prior to visit.     PAST MEDICAL HISTORY: Past Medical History:  Diagnosis Date  . Barrett esophagus   . Cancer ([place])    basal cell on scalp   . Edema   . GERD (gastroesophageal reflux disease)    takes nexium    . Hyperlipidemia   . Hypertension   . Sleep apnea   . Swelling     PAST SURGICAL HISTORY: Past Surgical History:  Procedure Laterality Date  . ABDOMINAL HYSTERECTOMY    . BASAL CELL CARCINOMA EXCISION  2016  . CESAREAN SECTION     c/s times two   . LAPAROSCOPIC  VAGINAL HYSTERECTOMY WITH SALPINGECTOMY Bilateral 04/12/2015   Procedure: LAPAROSCOPIC ASSISTED VAGINAL HYSTERECTOMY WITH SALPINGECTOMY;  Surgeon: Eldred Manges, MD;  Location: [facility];  Service: Gynecology;  Laterality: Bilateral;  . NASAL SEPTUM SURGERY      SOCIAL HISTORY: Social History   Tobacco Use  . Smoking status: Never Smoker  . Smokeless tobacco: Never Used  Substance Use Topics  . Alcohol use: Yes    Comment: occasional   . Drug use: No    FAMILY HISTORY: Family History  Problem Relation Age of Onset  . Hypertension Mother   . Hyperlipidemia Mother   . Heart disease Mother   . Thyroid disease Mother   . Obesity Mother   . Cancer Father   . Stroke Father   . Obesity Father   . Cancer Sister   . Parkinsonism Maternal Grandmother   . Parkinsonism Maternal Grandfather   . Cancer Paternal Grandmother   . Breast cancer Neg Hx    ROS: Review of Systems  Gastrointestinal: Negative for nausea and vomiting.  Musculoskeletal:       Negative for muscle weakness.   PHYSICAL EXAM: Pt in no acute distress  RECENT LABS AND TESTS: BMET    Component Value Date/Time   NA 142 06/12/2018 1314   K 4.2 06/12/2018 1314   CL 100 06/12/2018 1314   CO2 22 06/12/2018 1314   GLUCOSE 88 06/12/2018 1314   GLUCOSE 98 04/12/2015 0835   BUN 14 06/12/2018 1314   CREATININE 0.92 06/12/2018 1314   CALCIUM 9.8 06/12/2018 1314   GFRNONAA 70 06/12/2018 1314   GFRAA 80 06/12/2018 1314   Lab Results  Component Value Date   HGBA1C 5.5 06/12/2018   Lab Results  Component Value Date   INSULIN 19.6 06/12/2018   CBC    Component Value Date/Time   WBC 8.9 06/12/2018 1314   WBC 15.5 (H) 04/13/2015 0533   RBC 4.48 06/12/2018 1314   RBC 3.50 (L) 04/13/2015 0533   HGB 13.4 06/12/2018 1314   HCT 39.3 06/12/2018 1314   PLT 261 04/13/2015 0533   MCV 88 06/12/2018 1314   MCH 29.9 06/12/2018 1314   MCH 30.6 04/13/2015 0533   MCHC 34.1 06/12/2018 1314   MCHC 34.1 04/13/2015 0533    RDW 12.3 06/12/2018 1314   LYMPHSABS 2.7 06/12/2018 1314   EOSABS 0.3 06/12/2018 1314   BASOSABS 0.1 06/12/2018 1314   Iron/TIBC/Ferritin/ %Sat No results found for: IRON, TIBC, FERRITIN, IRONPCTSAT Lipid Panel     Component Value Date/Time   CHOL 181 06/12/2018 1314   TRIG 145 06/12/2018 1314   HDL 50 06/12/2018 1314   LDLCALC 102 (H) 06/12/2018 1314   Hepatic Function Panel     Component Value Date/Time   PROT 7.2 06/12/2018 1314   ALBUMIN 4.4 06/12/2018 1314   AST  35 06/12/2018 1314   ALT 48 (H) 06/12/2018 1314   ALKPHOS 107 06/12/2018 1314   BILITOT 0.4 06/12/2018 1314      Component Value Date/Time   TSH 3.950 06/12/2018 1314   TSH 1.806 04/13/2015 0623   Results for MLISS, WEDIN (MRN [id number]) as of 09/02/2018 11:15  Ref. Range 06/12/2018 13:14  Vitamin D, 25-Hydroxy Latest Ref Range: 30.0 - 100.0 ng/mL 43.6    I, Michaelene Song, am acting as Location manager for [employer], PA-C I, Abby Potash, PA-C have reviewed above note and agree with its content

## 2018-09-17 ENCOUNTER — Other Ambulatory Visit: Payer: Self-pay

## 2018-09-17 ENCOUNTER — Ambulatory Visit (INDEPENDENT_AMBULATORY_CARE_PROVIDER_SITE_OTHER): Payer: Managed Care, Other (non HMO) | Admitting: Physician Assistant

## 2018-09-17 ENCOUNTER — Encounter (INDEPENDENT_AMBULATORY_CARE_PROVIDER_SITE_OTHER): Payer: Self-pay | Admitting: Physician Assistant

## 2018-09-17 DIAGNOSIS — Z6835 Body mass index (BMI) 35.0-35.9, adult: Secondary | ICD-10-CM | POA: Diagnosis not present

## 2018-09-17 DIAGNOSIS — E7849 Other hyperlipidemia: Secondary | ICD-10-CM | POA: Diagnosis not present

## 2018-09-18 NOTE — Progress Notes (Signed)
Office: [phone]  /  Fax: [phone] TeleHealth Visit:  [name] has verbally consented to this TeleHealth visit today. The patient is located at home, the provider is located at the [employer] [facility] office. The participants in this visit include the listed provider and patient. The visit was conducted today via FaceTime.  HPI:   Chief Complaint: OBESITY [name] is here to discuss her progress with her obesity treatment plan. She is on the Category 2 plan + 8 oz. Fairlife fat free milk or 1/2 cup cottage cheese and is following her eating plan approximately 60% of the time. She states she is walking 30 minutes 5 times per week. [name] reports that her weight today is 239 lbs. She is doing well overall. She reports that her hunger is controlled. She struggles to get in all the meat at times with dinner.  We were unable to weigh the patient today for this TeleHealth visit. She states she weighed 239 lbs today at home. She has lost 12 lbs since starting treatment with [name].  Hyperlipidemia [name] has hyperlipidemia and has been trying to improve her cholesterol levels with intensive lifestyle modification including a low saturated fat diet, exercise and weight loss. She is on atorvastatin and denies any chest pain.  ASSESSMENT AND PLAN:  Other hyperlipidemia  Class 2 severe obesity with serious comorbidity and body mass index (BMI) of 35.0 to 35.9 in adult, unspecified obesity type ([place])  PLAN:  Hyperlipidemia [name] was informed of the American Heart Association Guidelines emphasizing intensive lifestyle modifications as the first line treatment for hyperlipidemia. We discussed many lifestyle modifications today in depth, and [name] will continue to work on decreasing saturated fats such as fatty red meat, butter and many fried foods. She will continue atorvastatin, increase vegetables and lean protein in her diet, and continue to work on exercise and weight loss  efforts.  Obesity [name] is currently in the action stage of change. As such, her goal is to continue with weight loss efforts. She has agreed to follow the Category 2 plan + 8 oz. Fairlife milk. [name] has been instructed to work up to a goal of 150 minutes of combined cardio and strengthening exercise per week for weight loss and overall health benefits. We discussed the following Behavioral Modification Strategies today: work on meal planning and easy cooking plans.  [name] has agreed to follow-up with our clinic in 3 weeks. She was informed of the importance of frequent follow-up visits to maximize her success with intensive lifestyle modifications for her multiple health conditions.  ALLERGIES: Allergies  Allergen Reactions  . Tape Other (See Comments)    Blisters and makes her skin peel    . Ciprofloxacin Nausea Only    MEDICATIONS: Current Outpatient Medications on File Prior to Visit  Medication Sig Dispense Refill  . atorvastatin (LIPITOR) 10 MG tablet Take 10 mg by mouth daily.    . Calcium Carb-Cholecalciferol (CALCIUM + D3) 600-200 MG-UNIT TABS Take 1 tablet by mouth daily.    . cholecalciferol (VITAMIN D3) 25 MCG (1000 UT) tablet Take 5,000 Units by mouth daily.     Marland Kitchen esomeprazole (NEXIUM) 20 MG capsule Take 20 mg by mouth daily at 12 noon.    . furosemide (LASIX) 40 MG tablet Take 40 mg by mouth daily.  3  . Multiple Vitamin (MULTIVITAMIN WITH MINERALS) TABS tablet Take 1 tablet by mouth daily.    . nebivolol (BYSTOLIC) 5 MG tablet Take 5 mg by mouth daily.    Marland Kitchen  olmesartan (BENICAR) 20 MG tablet TAKE 1 (ONE) TABLET DAILY  5   No current facility-administered medications on file prior to visit.     PAST MEDICAL HISTORY: Past Medical History:  Diagnosis Date  . Barrett esophagus   . Cancer ([place])    basal cell on scalp   . Edema   . GERD (gastroesophageal reflux disease)    takes nexium    . Hyperlipidemia   . Hypertension   . Sleep apnea   . Swelling      PAST SURGICAL HISTORY: Past Surgical History:  Procedure Laterality Date  . ABDOMINAL HYSTERECTOMY    . BASAL CELL CARCINOMA EXCISION  2016  . CESAREAN SECTION     c/s times two   . LAPAROSCOPIC VAGINAL HYSTERECTOMY WITH SALPINGECTOMY Bilateral 04/12/2015   Procedure: LAPAROSCOPIC ASSISTED VAGINAL HYSTERECTOMY WITH SALPINGECTOMY;  Surgeon: Eldred Manges, MD;  Location: [facility];  Service: Gynecology;  Laterality: Bilateral;  . NASAL SEPTUM SURGERY      SOCIAL HISTORY: Social History   Tobacco Use  . Smoking status: Never Smoker  . Smokeless tobacco: Never Used  Substance Use Topics  . Alcohol use: Yes    Comment: occasional   . Drug use: No    FAMILY HISTORY: Family History  Problem Relation Age of Onset  . Hypertension Mother   . Hyperlipidemia Mother   . Heart disease Mother   . Thyroid disease Mother   . Obesity Mother   . Cancer Father   . Stroke Father   . Obesity Father   . Cancer Sister   . Parkinsonism Maternal Grandmother   . Parkinsonism Maternal Grandfather   . Cancer Paternal Grandmother   . Breast cancer Neg Hx    ROS: Review of Systems  Cardiovascular: Negative for chest pain.   PHYSICAL EXAM: Pt in no acute distress  RECENT LABS AND TESTS: BMET    Component Value Date/Time   NA 142 06/12/2018 1314   K 4.2 06/12/2018 1314   CL 100 06/12/2018 1314   CO2 22 06/12/2018 1314   GLUCOSE 88 06/12/2018 1314   GLUCOSE 98 04/12/2015 0835   BUN 14 06/12/2018 1314   CREATININE 0.92 06/12/2018 1314   CALCIUM 9.8 06/12/2018 1314   GFRNONAA 70 06/12/2018 1314   GFRAA 80 06/12/2018 1314   Lab Results  Component Value Date   HGBA1C 5.5 06/12/2018   Lab Results  Component Value Date   INSULIN 19.6 06/12/2018   CBC    Component Value Date/Time   WBC 8.9 06/12/2018 1314   WBC 15.5 (H) 04/13/2015 0533   RBC 4.48 06/12/2018 1314   RBC 3.50 (L) 04/13/2015 0533   HGB 13.4 06/12/2018 1314   HCT 39.3 06/12/2018 1314   PLT 261 04/13/2015 0533    MCV 88 06/12/2018 1314   MCH 29.9 06/12/2018 1314   MCH 30.6 04/13/2015 0533   MCHC 34.1 06/12/2018 1314   MCHC 34.1 04/13/2015 0533   RDW 12.3 06/12/2018 1314   LYMPHSABS 2.7 06/12/2018 1314   EOSABS 0.3 06/12/2018 1314   BASOSABS 0.1 06/12/2018 1314   Iron/TIBC/Ferritin/ %Sat No results found for: IRON, TIBC, FERRITIN, IRONPCTSAT Lipid Panel     Component Value Date/Time   CHOL 181 06/12/2018 1314   TRIG 145 06/12/2018 1314   HDL 50 06/12/2018 1314   LDLCALC 102 (H) 06/12/2018 1314   Hepatic Function Panel     Component Value Date/Time   PROT 7.2 06/12/2018 1314   ALBUMIN 4.4 06/12/2018 1314  AST 35 06/12/2018 1314   ALT 48 (H) 06/12/2018 1314   ALKPHOS 107 06/12/2018 1314   BILITOT 0.4 06/12/2018 1314      Component Value Date/Time   TSH 3.950 06/12/2018 1314   TSH 1.806 04/13/2015 0623   Results for SAGAL, GAYTON (MRN [id number]) as of 09/18/2018 08:48  Ref. Range 06/12/2018 13:14  Vitamin D, 25-Hydroxy Latest Ref Range: 30.0 - 100.0 ng/mL 43.6   I, Michaelene Song, am acting as Location manager for [employer], PA-C I, Abby Potash, PA-C have reviewed above note and agree with its content

## 2018-10-07 ENCOUNTER — Ambulatory Visit (INDEPENDENT_AMBULATORY_CARE_PROVIDER_SITE_OTHER): Payer: Managed Care, Other (non HMO) | Admitting: Physician Assistant

## 2018-10-07 ENCOUNTER — Other Ambulatory Visit: Payer: Self-pay

## 2018-10-07 DIAGNOSIS — Z6835 Body mass index (BMI) 35.0-35.9, adult: Secondary | ICD-10-CM

## 2018-10-07 DIAGNOSIS — E559 Vitamin D deficiency, unspecified: Secondary | ICD-10-CM | POA: Diagnosis not present

## 2018-10-07 NOTE — Progress Notes (Signed)
Office: [phone]  /  Fax: [phone] TeleHealth Visit:  [name] has verbally consented to this TeleHealth visit today. The patient is located at home, the provider is located at the [employer] [facility] office. The participants in this visit include the listed provider and patient. The visit was conducted today via FaceTime.  HPI:   Chief Complaint: OBESITY [name] is here to discuss her progress with her obesity treatment plan. She is on the Category 2 plan + 8-oz. [employer] milk and is following her eating plan approximately 60% of the time. She states she is walking 15 minutes 7 times per week. Shirlie reports her weight today is 242 lbs. The patient reports that she got off track and was eating comfort foods the past couple of weeks. She has been back on track the last 2 days.  We were unable to weigh the patient today for this TeleHealth visit. She states her weight today is 242 lbs. She has lost 12 lbs since starting treatment with [name].  Vitamin D deficiency [name] has a diagnosis of Vitamin D deficiency. She is currently taking OTC Vit D and denies nausea, vomiting or muscle weakness.  ASSESSMENT AND PLAN:  Vitamin D deficiency  Class 2 severe obesity with serious comorbidity and body mass index (BMI) of 35.0 to 35.9 in adult, unspecified obesity type ([place])  PLAN:  Vitamin D Deficiency [name] was informed that low Vitamin D levels contributes to fatigue and are associated with obesity, breast, and colon cancer. She agrees to continue to take OTC Vit D and will follow-up for routine testing of Vitamin D, at least 2-3 times per year. She was informed of the risk of over-replacement of Vitamin D and agrees to not increase her dose unless she discusses this with [name] first. [name] agrees to follow-up with our clinic in 3 weeks.  Obesity [name] is currently in the action stage of change. As such, her goal is to continue with weight loss efforts. She has agreed to  follow the Category 2 plan + 8 ounces of [employer] milk or 1/2 cup cottage cheese. [name] has been instructed to work up to a goal of 150 minutes of combined cardio and strengthening exercise per week for weight loss and overall health benefits. We discussed the following Behavioral Modification Strategies today: keeping healthy foods in the home and better snacking choices.  [name] has agreed to follow-up with our clinic in 3 weeks. She was informed of the importance of frequent follow-up visits to maximize her success with intensive lifestyle modifications for her multiple health conditions.  ALLERGIES: Allergies  Allergen Reactions  . Tape Other (See Comments)    Blisters and makes her skin peel    . Ciprofloxacin Nausea Only    MEDICATIONS: Current Outpatient Medications on File Prior to Visit  Medication Sig Dispense Refill  . atorvastatin (LIPITOR) 10 MG tablet Take 10 mg by mouth daily.    . Calcium Carb-Cholecalciferol (CALCIUM + D3) 600-200 MG-UNIT TABS Take 1 tablet by mouth daily.    . cholecalciferol (VITAMIN D3) 25 MCG (1000 UT) tablet Take 5,000 Units by mouth daily.     Marland Kitchen esomeprazole (NEXIUM) 20 MG capsule Take 20 mg by mouth daily at 12 noon.    . furosemide (LASIX) 40 MG tablet Take 40 mg by mouth daily.  3  . Multiple Vitamin (MULTIVITAMIN WITH MINERALS) TABS tablet Take 1 tablet by mouth daily.    . nebivolol (BYSTOLIC) 5 MG tablet Take 5 mg by  mouth daily.    Marland Kitchen olmesartan (BENICAR) 20 MG tablet TAKE 1 (ONE) TABLET DAILY  5   No current facility-administered medications on file prior to visit.     PAST MEDICAL HISTORY: Past Medical History:  Diagnosis Date  . Barrett esophagus   . Cancer ([place])    basal cell on scalp   . Edema   . GERD (gastroesophageal reflux disease)    takes nexium    . Hyperlipidemia   . Hypertension   . Sleep apnea   . Swelling     PAST SURGICAL HISTORY: Past Surgical History:  Procedure Laterality Date  . ABDOMINAL HYSTERECTOMY     . BASAL CELL CARCINOMA EXCISION  2016  . CESAREAN SECTION     c/s times two   . LAPAROSCOPIC VAGINAL HYSTERECTOMY WITH SALPINGECTOMY Bilateral 04/12/2015   Procedure: LAPAROSCOPIC ASSISTED VAGINAL HYSTERECTOMY WITH SALPINGECTOMY;  Surgeon: Eldred Manges, MD;  Location: [facility];  Service: Gynecology;  Laterality: Bilateral;  . NASAL SEPTUM SURGERY      SOCIAL HISTORY: Social History   Tobacco Use  . Smoking status: Never Smoker  . Smokeless tobacco: Never Used  Substance Use Topics  . Alcohol use: Yes    Comment: occasional   . Drug use: No    FAMILY HISTORY: Family History  Problem Relation Age of Onset  . Hypertension Mother   . Hyperlipidemia Mother   . Heart disease Mother   . Thyroid disease Mother   . Obesity Mother   . Cancer Father   . Stroke Father   . Obesity Father   . Cancer Sister   . Parkinsonism Maternal Grandmother   . Parkinsonism Maternal Grandfather   . Cancer Paternal Grandmother   . Breast cancer Neg Hx    ROS: Review of Systems  Gastrointestinal: Negative for nausea and vomiting.  Musculoskeletal:       Negative for muscle weakness.   PHYSICAL EXAM: Pt in no acute distress  RECENT LABS AND TESTS: BMET    Component Value Date/Time   NA 142 06/12/2018 1314   K 4.2 06/12/2018 1314   CL 100 06/12/2018 1314   CO2 22 06/12/2018 1314   GLUCOSE 88 06/12/2018 1314   GLUCOSE 98 04/12/2015 0835   BUN 14 06/12/2018 1314   CREATININE 0.92 06/12/2018 1314   CALCIUM 9.8 06/12/2018 1314   GFRNONAA 70 06/12/2018 1314   GFRAA 80 06/12/2018 1314   Lab Results  Component Value Date   HGBA1C 5.5 06/12/2018   Lab Results  Component Value Date   INSULIN 19.6 06/12/2018   CBC    Component Value Date/Time   WBC 8.9 06/12/2018 1314   WBC 15.5 (H) 04/13/2015 0533   RBC 4.48 06/12/2018 1314   RBC 3.50 (L) 04/13/2015 0533   HGB 13.4 06/12/2018 1314   HCT 39.3 06/12/2018 1314   PLT 261 04/13/2015 0533   MCV 88 06/12/2018 1314   MCH 29.9  06/12/2018 1314   MCH 30.6 04/13/2015 0533   MCHC 34.1 06/12/2018 1314   MCHC 34.1 04/13/2015 0533   RDW 12.3 06/12/2018 1314   LYMPHSABS 2.7 06/12/2018 1314   EOSABS 0.3 06/12/2018 1314   BASOSABS 0.1 06/12/2018 1314   Iron/TIBC/Ferritin/ %Sat No results found for: IRON, TIBC, FERRITIN, IRONPCTSAT Lipid Panel     Component Value Date/Time   CHOL 181 06/12/2018 1314   TRIG 145 06/12/2018 1314   HDL 50 06/12/2018 1314   LDLCALC 102 (H) 06/12/2018 1314   Hepatic Function Panel  Component Value Date/Time   PROT 7.2 06/12/2018 1314   ALBUMIN 4.4 06/12/2018 1314   AST 35 06/12/2018 1314   ALT 48 (H) 06/12/2018 1314   ALKPHOS 107 06/12/2018 1314   BILITOT 0.4 06/12/2018 1314      Component Value Date/Time   TSH 3.950 06/12/2018 1314   TSH 1.806 04/13/2015 0623   Results for DOREATHA, OFFER (MRN [id number]) as of 10/07/2018 11:59  Ref. Range 06/12/2018 13:14  Vitamin D, 25-Hydroxy Latest Ref Range: 30.0 - 100.0 ng/mL 43.6    I, Michaelene Song, am acting as Location manager for [employer], PA-C I, Abby Potash, PA-C have reviewed above note and agree with its content

## 2018-10-17 IMAGING — US US EXTREM LOW VENOUS BILAT
1 series · 13 of 24 positions shown · non-contrast
Comparison: None.

CLINICAL DATA: 56-year-old female with bilateral lower extremity
edema. Symptoms have improved with the diuretics and lower extremity
compression stockings. Evaluate for venous insufficiency.

EXAM:
BILATERAL LOWER EXTREMITY VENOUS DUPLEX ULTRASOUND
TECHNIQUE: Gray-scale sonography with graded compression, as well as color
Doppler and duplex ultrasound, were performed to evaluate the deep
and superficial veins of both lower extremities. Spectral Doppler
was utilized to evaluate flow at rest and with distal augmentation
maneuvers. A complete superficial venous insufficiency exam was
performed in the upright standing position. I personally performed
the technical portion of the exam.

[Series 1: us extrem low venous bilat · 0.09mm/px · 13 of 58 slices shown]
[im 1/58]
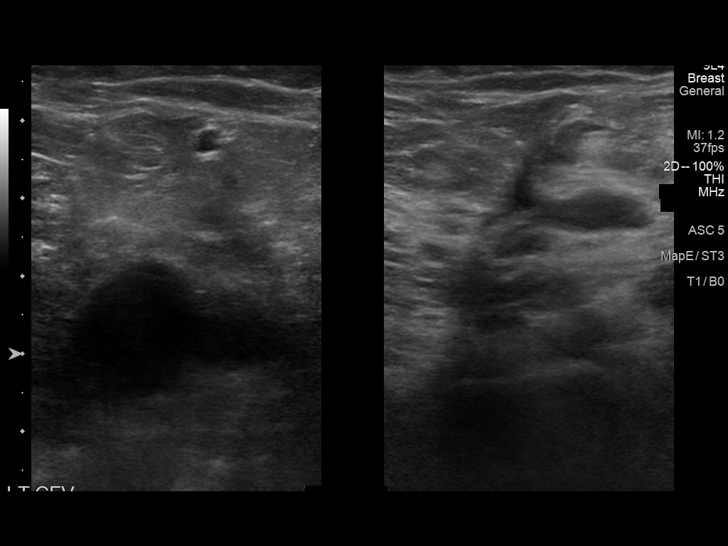
[im 5/58]
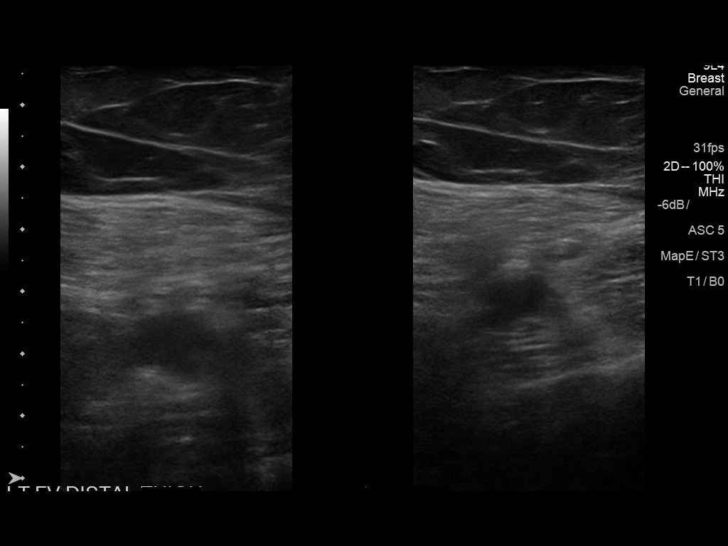
[im 10/58]
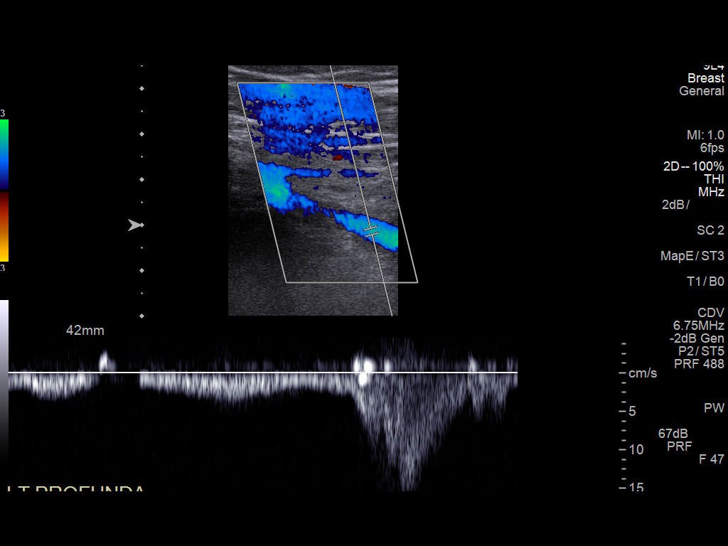
[im 15/58]
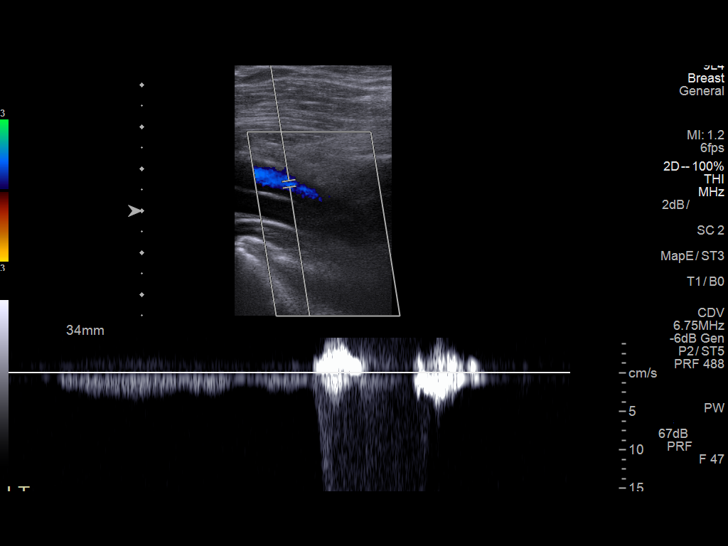
[im 20/58]
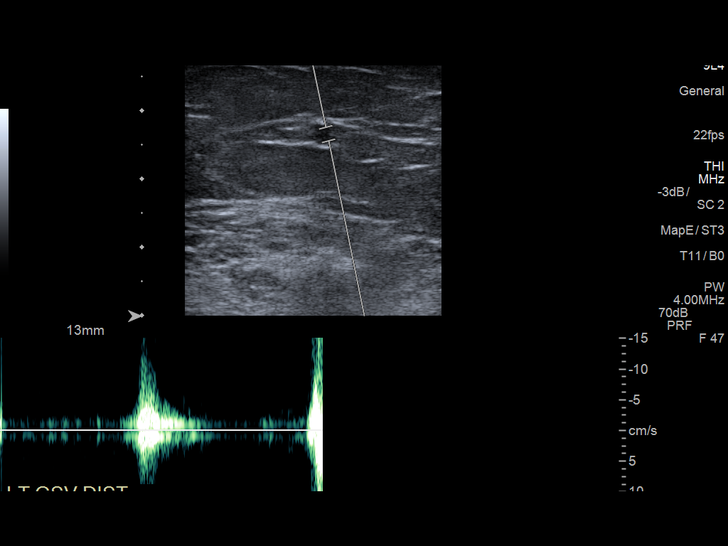
[im 25/58]
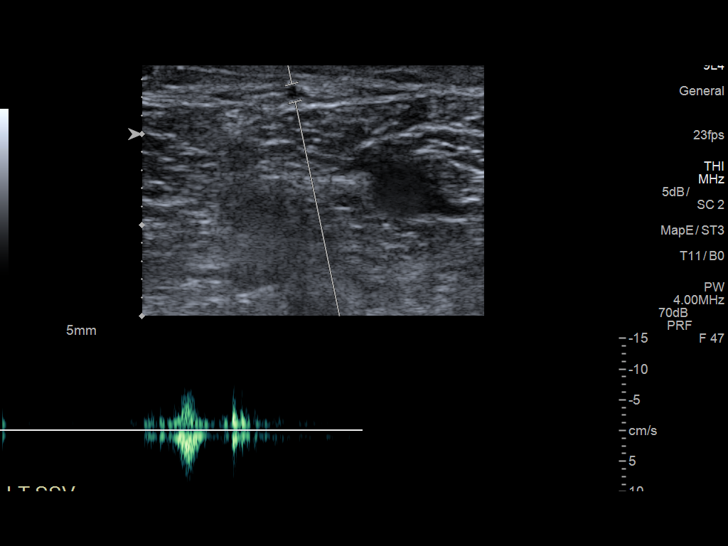
[im 30/58]
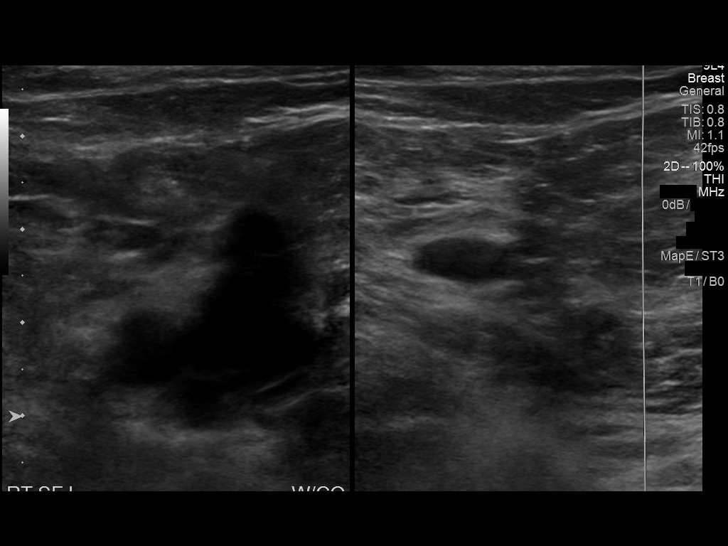
[im 33/58]
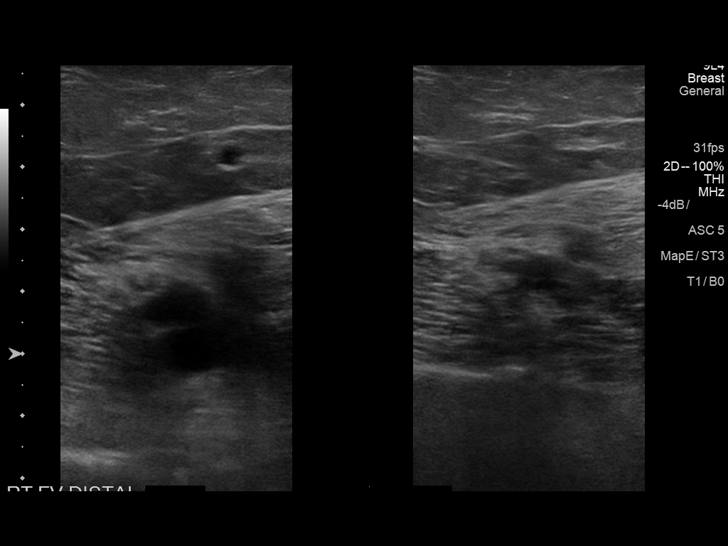
[im 38/58]
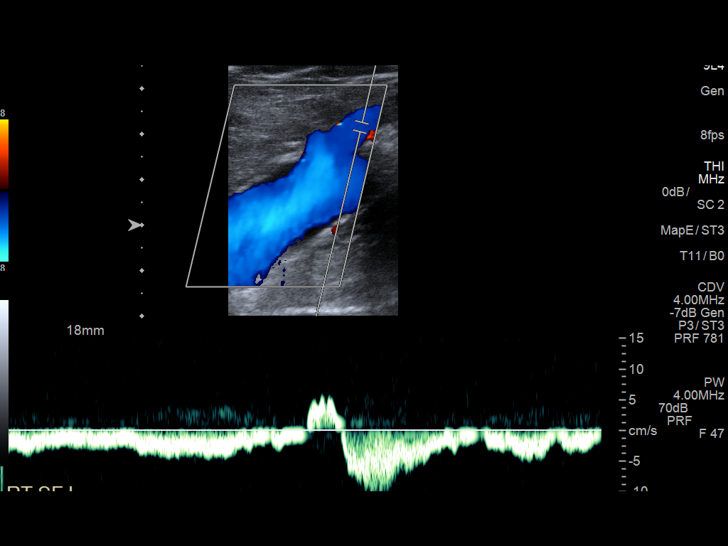
[im 43/58]
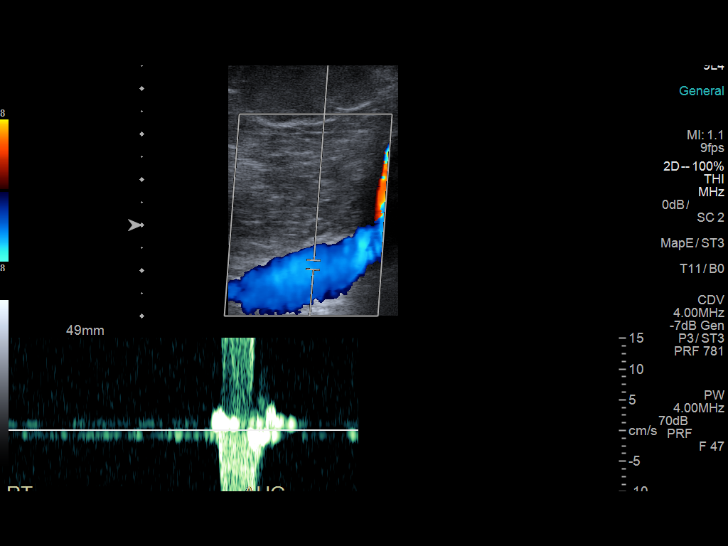
[im 48/58]
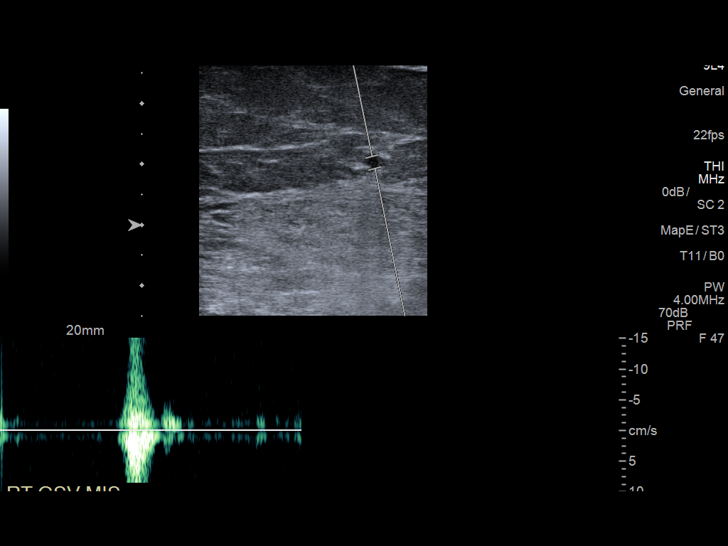
[im 53/58]
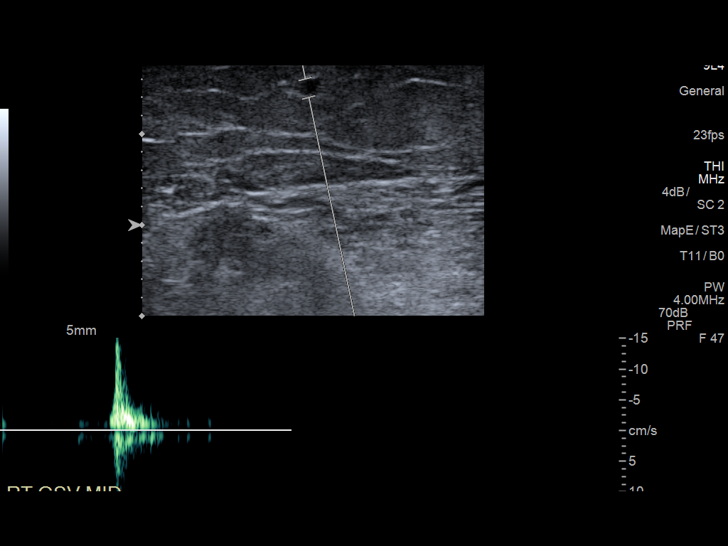
[im 58/58]
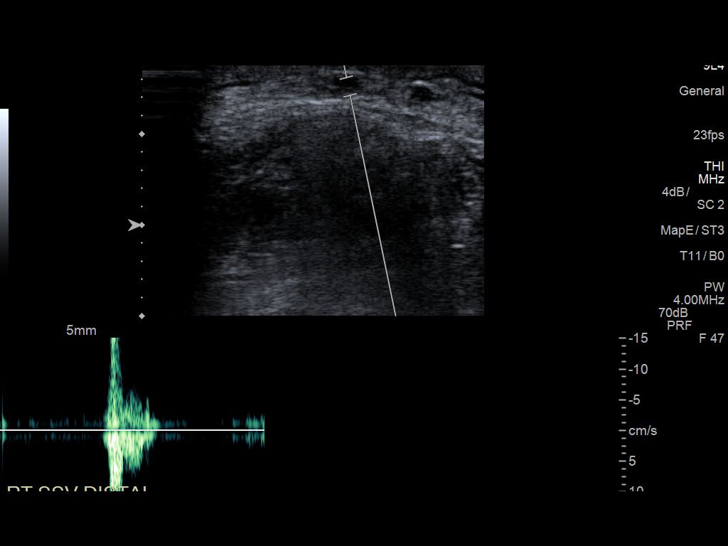

[13 of 24 positions shown; findings below may reference images not displayed]

FINDINGS: Right lower extremity: Deep venous system: Normal compressibility,
augmentation and color Doppler flow in the right common femoral
vein, right femoral vein and right popliteal vein without thrombus.
The right saphenofemoral junction is patent. Right profunda femoral
vein is patent without thrombus. Visualized right deep calf veins
are patent without thrombus.

Superficial venous system: Normal caliber of the right great
saphenous vein without reflux. Normal caliber of the right short
saphenous vein without reflux. No varicosities.

Left lower extremity: Deep venous system: Normal compressibility,
augmentation and color Doppler flow in the left common femoral vein,
left femoral vein and left popliteal vein without thrombus. The left
saphenofemoral junction is patent. Left profunda femoral vein is
patent without thrombus. Visualized left deep calf veins are patent
without thrombus.

Superficial venous system: Normal caliber of the left great
saphenous vein without reflux. Normal caliber of the left short
saphenous vein without reflux. No varicosities.
IMPRESSION: No evidence for superficial venous insufficiency or reflux in the
lower extremities.

No evidence of deep venous thrombosis in the lower extremities.

## 2018-10-28 ENCOUNTER — Other Ambulatory Visit: Payer: Self-pay

## 2018-10-28 ENCOUNTER — Encounter (INDEPENDENT_AMBULATORY_CARE_PROVIDER_SITE_OTHER): Payer: Self-pay | Admitting: Physician Assistant

## 2018-10-28 ENCOUNTER — Ambulatory Visit (INDEPENDENT_AMBULATORY_CARE_PROVIDER_SITE_OTHER): Payer: Managed Care, Other (non HMO) | Admitting: Physician Assistant

## 2018-10-28 DIAGNOSIS — E559 Vitamin D deficiency, unspecified: Secondary | ICD-10-CM

## 2018-10-28 DIAGNOSIS — Z6835 Body mass index (BMI) 35.0-35.9, adult: Secondary | ICD-10-CM

## 2018-10-28 NOTE — Progress Notes (Signed)
Office: [phone]  /  Fax: [phone] TeleHealth Visit:  [name] has verbally consented to this TeleHealth visit today. The patient is located at home, the provider is located at the [employer] [facility] office. The participants in this visit include the listed provider and patient and any and all parties involved. The visit was conducted today via FaceTime.  HPI:   Chief Complaint: OBESITY [name] is here to discuss her progress with her obesity treatment plan. She is on the Category 2 plan and is following her eating plan approximately 80 % of the time. She states she is walking 20 to 30 minutes 5 times per week. Kieley did very well, getting back on track over the last couple of weeks. She feels as if she is stuck at 239 pounds, and she wants to try journaling. We were unable to weigh the patient today for this TeleHealth visit. She feels as if she has lost weight since her last visit. She has lost 19 lbs since starting treatment with [name].  Vitamin D deficiency [name] has a diagnosis of vitamin D deficiency. She is currently taking OTC vit D and denies nausea, vomiting or muscle weakness.  ASSESSMENT AND PLAN:  No diagnosis found.  PLAN:  Vitamin D Deficiency [name] was informed that low vitamin D levels contributes to fatigue and are associated with obesity, breast, and colon cancer. She will continue to take OTC Vitamin D and will follow up for routine testing of vitamin D, at least 2-3 times per year. She was informed of the risk of over-replacement of vitamin D and agrees to not increase her dose unless she discusses this with [name] first.  Obesity [name] is currently in the action stage of change. As such, her goal is to continue with weight loss efforts She has agreed to keep a food journal with 1200 calories and 98 grams of protein daily +8 ounces of milk [name] has been instructed to work up to a goal of 150 minutes of combined cardio and strengthening  exercise per week for weight loss and overall health benefits. We discussed the following Behavioral Modification Strategies today: increasing lean protein intake, decreasing simple carbohydrates  and work on meal planning and easy cooking plans  [name] has agreed to follow up with our clinic in 3 weeks. She was informed of the importance of frequent follow up visits to maximize her success with intensive lifestyle modifications for her multiple health conditions.  ALLERGIES: Allergies  Allergen Reactions  . Tape Other (See Comments)    Blisters and makes her skin peel    . Ciprofloxacin Nausea Only    MEDICATIONS: Current Outpatient Medications on File Prior to Visit  Medication Sig Dispense Refill  . atorvastatin (LIPITOR) 10 MG tablet Take 10 mg by mouth daily.    . Calcium Carb-Cholecalciferol (CALCIUM + D3) 600-200 MG-UNIT TABS Take 1 tablet by mouth daily.    . cholecalciferol (VITAMIN D3) 25 MCG (1000 UT) tablet Take 5,000 Units by mouth daily.     Marland Kitchen esomeprazole (NEXIUM) 20 MG capsule Take 20 mg by mouth daily at 12 noon.    . furosemide (LASIX) 40 MG tablet Take 40 mg by mouth daily.  3  . Multiple Vitamin (MULTIVITAMIN WITH MINERALS) TABS tablet Take 1 tablet by mouth daily.    . nebivolol (BYSTOLIC) 5 MG tablet Take 5 mg by mouth daily.    Marland Kitchen olmesartan (BENICAR) 20 MG tablet TAKE 1 (ONE) TABLET DAILY  5   No  current facility-administered medications on file prior to visit.     PAST MEDICAL HISTORY: Past Medical History:  Diagnosis Date  . Barrett esophagus   . Cancer ([place])    basal cell on scalp   . Edema   . GERD (gastroesophageal reflux disease)    takes nexium    . Hyperlipidemia   . Hypertension   . Sleep apnea   . Swelling     PAST SURGICAL HISTORY: Past Surgical History:  Procedure Laterality Date  . ABDOMINAL HYSTERECTOMY    . BASAL CELL CARCINOMA EXCISION  2016  . CESAREAN SECTION     c/s times two   . LAPAROSCOPIC VAGINAL HYSTERECTOMY WITH  SALPINGECTOMY Bilateral 04/12/2015   Procedure: LAPAROSCOPIC ASSISTED VAGINAL HYSTERECTOMY WITH SALPINGECTOMY;  Surgeon: Eldred Manges, MD;  Location: [facility];  Service: Gynecology;  Laterality: Bilateral;  . NASAL SEPTUM SURGERY      SOCIAL HISTORY: Social History   Tobacco Use  . Smoking status: Never Smoker  . Smokeless tobacco: Never Used  Substance Use Topics  . Alcohol use: Yes    Comment: occasional   . Drug use: No    FAMILY HISTORY: Family History  Problem Relation Age of Onset  . Hypertension Mother   . Hyperlipidemia Mother   . Heart disease Mother   . Thyroid disease Mother   . Obesity Mother   . Cancer Father   . Stroke Father   . Obesity Father   . Cancer Sister   . Parkinsonism Maternal Grandmother   . Parkinsonism Maternal Grandfather   . Cancer Paternal Grandmother   . Breast cancer Neg Hx     ROS: Review of Systems  Constitutional: Positive for weight loss.  Gastrointestinal: Negative for nausea and vomiting.  Musculoskeletal:       Negative for muscle weakness    PHYSICAL EXAM: Pt in no acute distress  RECENT LABS AND TESTS: BMET    Component Value Date/Time   NA 142 06/12/2018 1314   K 4.2 06/12/2018 1314   CL 100 06/12/2018 1314   CO2 22 06/12/2018 1314   GLUCOSE 88 06/12/2018 1314   GLUCOSE 98 04/12/2015 0835   BUN 14 06/12/2018 1314   CREATININE 0.92 06/12/2018 1314   CALCIUM 9.8 06/12/2018 1314   GFRNONAA 70 06/12/2018 1314   GFRAA 80 06/12/2018 1314   Lab Results  Component Value Date   HGBA1C 5.5 06/12/2018   Lab Results  Component Value Date   INSULIN 19.6 06/12/2018   CBC    Component Value Date/Time   WBC 8.9 06/12/2018 1314   WBC 15.5 (H) 04/13/2015 0533   RBC 4.48 06/12/2018 1314   RBC 3.50 (L) 04/13/2015 0533   HGB 13.4 06/12/2018 1314   HCT 39.3 06/12/2018 1314   PLT 261 04/13/2015 0533   MCV 88 06/12/2018 1314   MCH 29.9 06/12/2018 1314   MCH 30.6 04/13/2015 0533   MCHC 34.1 06/12/2018 1314   MCHC  34.1 04/13/2015 0533   RDW 12.3 06/12/2018 1314   LYMPHSABS 2.7 06/12/2018 1314   EOSABS 0.3 06/12/2018 1314   BASOSABS 0.1 06/12/2018 1314   Iron/TIBC/Ferritin/ %Sat No results found for: IRON, TIBC, FERRITIN, IRONPCTSAT Lipid Panel     Component Value Date/Time   CHOL 181 06/12/2018 1314   TRIG 145 06/12/2018 1314   HDL 50 06/12/2018 1314   LDLCALC 102 (H) 06/12/2018 1314   Hepatic Function Panel     Component Value Date/Time   PROT 7.2 06/12/2018 1314  ALBUMIN 4.4 06/12/2018 1314   AST 35 06/12/2018 1314   ALT 48 (H) 06/12/2018 1314   ALKPHOS 107 06/12/2018 1314   BILITOT 0.4 06/12/2018 1314      Component Value Date/Time   TSH 3.950 06/12/2018 1314   TSH 1.806 04/13/2015 0623    Results for SAMON, DISHNER (MRN [id number]) as of 10/28/2018 13:12  Ref. Range 06/12/2018 13:14  Vitamin D, 25-Hydroxy Latest Ref Range: 30.0 - 100.0 ng/mL 43.6    I, Doreene Nest, am acting as Location manager for [employer], PA-C I, Abby Potash, PA-C have reviewed above note and agree with its content

## 2018-11-18 ENCOUNTER — Telehealth (INDEPENDENT_AMBULATORY_CARE_PROVIDER_SITE_OTHER): Payer: Managed Care, Other (non HMO) | Admitting: Physician Assistant

## 2018-11-19 ENCOUNTER — Encounter (INDEPENDENT_AMBULATORY_CARE_PROVIDER_SITE_OTHER): Payer: Self-pay

## 2018-11-21 ENCOUNTER — Ambulatory Visit (INDEPENDENT_AMBULATORY_CARE_PROVIDER_SITE_OTHER): Payer: Managed Care, Other (non HMO) | Admitting: Physician Assistant

## 2018-11-26 ENCOUNTER — Ambulatory Visit (INDEPENDENT_AMBULATORY_CARE_PROVIDER_SITE_OTHER): Payer: Managed Care, Other (non HMO) | Admitting: Physician Assistant

## 2018-11-26 ENCOUNTER — Encounter (INDEPENDENT_AMBULATORY_CARE_PROVIDER_SITE_OTHER): Payer: Self-pay | Admitting: Physician Assistant

## 2018-11-26 ENCOUNTER — Other Ambulatory Visit: Payer: Self-pay

## 2018-11-26 VITALS — BP 111/70 | HR 66 | Temp 98.1°F | Ht 70.0 in | Wt 239.0 lb

## 2018-11-26 DIAGNOSIS — E559 Vitamin D deficiency, unspecified: Secondary | ICD-10-CM

## 2018-11-26 DIAGNOSIS — Z6834 Body mass index (BMI) 34.0-34.9, adult: Secondary | ICD-10-CM | POA: Diagnosis not present

## 2018-11-26 DIAGNOSIS — E669 Obesity, unspecified: Secondary | ICD-10-CM

## 2018-11-26 NOTE — Progress Notes (Signed)
Office: [phone]  /  Fax: [phone]   HPI:   Chief Complaint: OBESITY [name] is here to discuss her progress with her obesity treatment plan. She is on the Category 2 plan and is following her eating plan approximately 70% of the time. She states she is walking 20-30 minutes 4 times per week. Leshae reports that she has been eating more sweets and pasta recently. She is ready to get back on track.  Her weight is 239 lb (108.4 kg) today and has had a weight loss of 7 pounds over a period of 4 months since her last in-office visit. She has lost 19 lbs since starting treatment with [name].  Vitamin D deficiency [name] has a diagnosis of Vitamin D deficiency. She is currently taking OTC Vit D and denies nausea, vomiting or muscle weakness.  ASSESSMENT AND PLAN:  Vitamin D deficiency  Class 1 obesity with serious comorbidity and body mass index (BMI) of 34.0 to 34.9 in adult, unspecified obesity type  PLAN:  Vitamin D Deficiency [name] was informed that low Vitamin D levels contributes to fatigue and are associated with obesity, breast, and colon cancer. She agrees to continue taking OTC Vit D and will follow-up for routine testing of Vitamin D, at least 2-3 times per year. She was informed of the risk of over-replacement of Vitamin D and agrees to not increase her dose unless she discusses this with [name] first. [name] agrees to follow-up with our clinic in 3 weeks.  I spent > than 50% of the 25 minute visit on counseling as documented in the note.  I spent > than 50% of the 25 minute visit on counseling as documented in the note.   Obesity [name] is currently in the action stage of change. As such, her goal is to continue with weight loss efforts. She has agreed to follow the Category 2 plan. [name] has been instructed to work up to a goal of 150 minutes of combined cardio and strengthening exercise per week for weight loss and overall health benefits. We discussed the following  Behavioral Modification Strategies today: increasing lean protein intake and better snacking choices.  [name] has agreed to follow-up with our clinic in 3 weeks. She was informed of the importance of frequent follow-up visits to maximize her success with intensive lifestyle modifications for her multiple health conditions.  ALLERGIES: Allergies  Allergen Reactions   Tape Other (See Comments)    Blisters and makes her skin peel     Ciprofloxacin Nausea Only    MEDICATIONS: Current Outpatient Medications on File Prior to Visit  Medication Sig Dispense Refill   atorvastatin (LIPITOR) 10 MG tablet Take 10 mg by mouth daily.     Calcium Carb-Cholecalciferol (CALCIUM + D3) 600-200 MG-UNIT TABS Take 1 tablet by mouth daily.     cholecalciferol (VITAMIN D3) 25 MCG (1000 UT) tablet Take 5,000 Units by mouth daily.      esomeprazole (NEXIUM) 20 MG capsule Take 20 mg by mouth daily at 12 noon.     furosemide (LASIX) 40 MG tablet Take 40 mg by mouth daily.  3   Multiple Vitamin (MULTIVITAMIN WITH MINERALS) TABS tablet Take 1 tablet by mouth daily.     nebivolol (BYSTOLIC) 5 MG tablet Take 5 mg by mouth daily.     olmesartan (BENICAR) 20 MG tablet TAKE 1 (ONE) TABLET DAILY  5   No current facility-administered medications on file prior to visit.     PAST MEDICAL HISTORY: Past Medical History:  Diagnosis Date   Barrett esophagus    Cancer ([place])    basal cell on scalp    Edema    GERD (gastroesophageal reflux disease)    takes nexium     Hyperlipidemia    Hypertension    Sleep apnea    Swelling     PAST SURGICAL HISTORY: Past Surgical History:  Procedure Laterality Date   ABDOMINAL HYSTERECTOMY     BASAL CELL CARCINOMA EXCISION  2016   CESAREAN SECTION     c/s times two    LAPAROSCOPIC VAGINAL HYSTERECTOMY WITH SALPINGECTOMY Bilateral 04/12/2015   Procedure: LAPAROSCOPIC ASSISTED VAGINAL HYSTERECTOMY WITH SALPINGECTOMY;  Surgeon: Eldred Manges, MD;   Location: [facility];  Service: Gynecology;  Laterality: Bilateral;   NASAL SEPTUM SURGERY      SOCIAL HISTORY: Social History   Tobacco Use   Smoking status: Never Smoker   Smokeless tobacco: Never Used  Substance Use Topics   Alcohol use: Yes    Comment: occasional    Drug use: No    FAMILY HISTORY: Family History  Problem Relation Age of Onset   Hypertension Mother    Hyperlipidemia Mother    Heart disease Mother    Thyroid disease Mother    Obesity Mother    Cancer Father    Stroke Father    Obesity Father    Cancer Sister    Parkinsonism Maternal Grandmother    Parkinsonism Maternal Grandfather    Cancer Paternal Grandmother    Breast cancer Neg Hx    ROS: Review of Systems  Gastrointestinal: Negative for nausea and vomiting.  Musculoskeletal:       Negative for muscle weakness.   PHYSICAL EXAM: Blood pressure 111/70, pulse 66, temperature 98.1 F (36.7 C), height 5\' 10"  (1.778 m), weight 239 lb (108.4 kg), SpO2 98 %. Body mass index is 34.29 kg/m. Physical Exam Vitals signs reviewed.  Constitutional:      Appearance: Normal appearance. She is obese.  Cardiovascular:     Rate and Rhythm: Normal rate.     Pulses: Normal pulses.  Pulmonary:     Effort: Pulmonary effort is normal.     Breath sounds: Normal breath sounds.  Musculoskeletal: Normal range of motion.  Skin:    General: Skin is warm and dry.  Neurological:     Mental Status: She is alert and oriented to person, place, and time.  Psychiatric:        Behavior: Behavior normal.   RECENT LABS AND TESTS: BMET    Component Value Date/Time   NA 142 06/12/2018 1314   K 4.2 06/12/2018 1314   CL 100 06/12/2018 1314   CO2 22 06/12/2018 1314   GLUCOSE 88 06/12/2018 1314   GLUCOSE 98 04/12/2015 0835   BUN 14 06/12/2018 1314   CREATININE 0.92 06/12/2018 1314   CALCIUM 9.8 06/12/2018 1314   GFRNONAA 70 06/12/2018 1314   GFRAA 80 06/12/2018 1314   Lab Results  Component Value  Date   HGBA1C 5.5 06/12/2018   Lab Results  Component Value Date   INSULIN 19.6 06/12/2018   CBC    Component Value Date/Time   WBC 8.9 06/12/2018 1314   WBC 15.5 (H) 04/13/2015 0533   RBC 4.48 06/12/2018 1314   RBC 3.50 (L) 04/13/2015 0533   HGB 13.4 06/12/2018 1314   HCT 39.3 06/12/2018 1314   PLT 261 04/13/2015 0533   MCV 88 06/12/2018 1314   MCH 29.9 06/12/2018 1314   MCH 30.6 04/13/2015 0533  MCHC 34.1 06/12/2018 1314   MCHC 34.1 04/13/2015 0533   RDW 12.3 06/12/2018 1314   LYMPHSABS 2.7 06/12/2018 1314   EOSABS 0.3 06/12/2018 1314   BASOSABS 0.1 06/12/2018 1314   Iron/TIBC/Ferritin/ %Sat No results found for: IRON, TIBC, FERRITIN, IRONPCTSAT Lipid Panel     Component Value Date/Time   CHOL 181 06/12/2018 1314   TRIG 145 06/12/2018 1314   HDL 50 06/12/2018 1314   LDLCALC 102 (H) 06/12/2018 1314   Hepatic Function Panel     Component Value Date/Time   PROT 7.2 06/12/2018 1314   ALBUMIN 4.4 06/12/2018 1314   AST 35 06/12/2018 1314   ALT 48 (H) 06/12/2018 1314   ALKPHOS 107 06/12/2018 1314   BILITOT 0.4 06/12/2018 1314      Component Value Date/Time   TSH 3.950 06/12/2018 1314   TSH 1.806 04/13/2015 0623   Results for Tracy, Forbes (MRN [id number]) as of 11/26/2018 14:18  Ref. Range 06/12/2018 13:14  Vitamin D, 25-Hydroxy Latest Ref Range: 30.0 - 100.0 ng/mL 43.6   OBESITY BEHAVIORAL INTERVENTION VISIT  Today's visit was #9   Starting weight: 258 lbs Starting date: 06/12/2018 Today's weight: 239 lbs  Today's date: 11/26/2018 Total lbs lost to date: 19   11/26/2018  Height 5\' 10"  (1.778 m)  Weight 239 lb (108.4 kg)  BMI (Calculated) 34.29  BLOOD PRESSURE - SYSTOLIC 902  BLOOD PRESSURE - DIASTOLIC 70   Body Fat % 40.9 %  Total Body Water (lbs) 95.21 lbs   ASK: We discussed the diagnosis of obesity with [name] today and [name] agreed to give [name] permission to discuss obesity behavioral modification therapy today.  ASSESS: Ardis has  the diagnosis of obesity and her BMI today is 34.3. Aleah is in the action stage of change.   ADVISE: Suzanna was educated on the multiple health risks of obesity as well as the benefit of weight loss to improve her health. She was advised of the need for long term treatment and the importance of lifestyle modifications to improve her current health and to decrease her risk of future health problems.  AGREE: Multiple dietary modification options and treatment options were discussed and  Elyzabeth agreed to follow the recommendations documented in the above note.  ARRANGE: Breean was educated on the importance of frequent visits to treat obesity as outlined per CMS and USPSTF guidelines and agreed to schedule her next follow up appointment today.  Migdalia Dk, am acting as transcriptionist for Abby Potash, PA-C I, Abby Potash, PA-C have reviewed above note and agree with its content

## 2018-12-16 ENCOUNTER — Ambulatory Visit (INDEPENDENT_AMBULATORY_CARE_PROVIDER_SITE_OTHER): Payer: Managed Care, Other (non HMO) | Admitting: Physician Assistant

## 2018-12-16 ENCOUNTER — Other Ambulatory Visit: Payer: Self-pay

## 2018-12-16 ENCOUNTER — Encounter (INDEPENDENT_AMBULATORY_CARE_PROVIDER_SITE_OTHER): Payer: Self-pay | Admitting: Physician Assistant

## 2018-12-16 VITALS — BP 98/65 | HR 72 | Temp 98.1°F | Ht 70.0 in | Wt 235.0 lb

## 2018-12-16 DIAGNOSIS — Z6833 Body mass index (BMI) 33.0-33.9, adult: Secondary | ICD-10-CM

## 2018-12-16 DIAGNOSIS — Z9189 Other specified personal risk factors, not elsewhere classified: Secondary | ICD-10-CM

## 2018-12-16 DIAGNOSIS — E559 Vitamin D deficiency, unspecified: Secondary | ICD-10-CM | POA: Diagnosis not present

## 2018-12-16 DIAGNOSIS — E7849 Other hyperlipidemia: Secondary | ICD-10-CM | POA: Diagnosis not present

## 2018-12-16 DIAGNOSIS — E669 Obesity, unspecified: Secondary | ICD-10-CM

## 2018-12-16 DIAGNOSIS — E8881 Metabolic syndrome: Secondary | ICD-10-CM | POA: Diagnosis not present

## 2018-12-16 NOTE — Progress Notes (Signed)
Office: [phone]  /  Fax: [phone]   HPI:   Chief Complaint: OBESITY [name] is here to discuss her progress with her obesity treatment plan. She is on the Category 2 plan and is following her eating plan approximately 75% of the time. She states she is walking 30 minutes 3 times per week. [name] reports that she has followed the plan well the last few weeks and states her cravings for sweets have decreased. She reports getting in plenty of water.  Her weight is 235 lb (106.6 kg) today and has had a weight loss of 4 pounds over a period of 3 weeks since her last visit. She has lost 23 lbs since starting treatment with [name].  Vitamin D deficiency [name] has a diagnosis of Vitamin D deficiency. She is currently taking Vit D and denies nausea, vomiting or muscle weakness.  Hyperlipidemia [name] has hyperlipidemia and has been trying to improve her cholesterol levels with intensive lifestyle modification including a low saturated fat diet, exercise and weight loss. She is on no medication and denies any chest pain.   At risk for cardiovascular disease [name] is at a higher than average risk for cardiovascular disease due to obesity. She currently denies any chest pain.  Insulin Resistance [name] has a diagnosis of insulin resistance based on her elevated fasting insulin level >5. Although [name]'s blood glucose readings are still under good control, insulin resistance puts her at greater risk of metabolic syndrome and diabetes. She is on no medications currently and continues to work on diet and exercise to decrease risk of diabetes. No polyphagia.  ASSESSMENT AND PLAN:  Vitamin D deficiency - Plan: VITAMIN D 25 Hydroxy (Vit-D Deficiency, Fractures)  Other hyperlipidemia - Plan: Lipid Panel With LDL/HDL Ratio  Insulin resistance - Plan: Comprehensive metabolic panel, Hemoglobin A1c, Insulin, random  At risk for heart disease  Class 1 obesity with serious comorbidity and body mass  index (BMI) of 33.0 to 33.9 in adult, unspecified obesity type  PLAN:  Vitamin D Deficiency [name] was informed that low Vitamin D levels contributes to fatigue and are associated with obesity, breast, and colon cancer. She agrees to continue to take prescription Vit D @ 50,000 IU every week and will follow-up for routine testing of Vitamin D, at least 2-3 times per year. She was informed of the risk of over-replacement of Vitamin D and agrees to not increase her dose unless she discusses this with [name] first. [name] agrees to follow-up with our clinic in 3 weeks.  Hyperlipidemia [name] was informed of the American Heart Association Guidelines emphasizing intensive lifestyle modifications as the first line treatment for hyperlipidemia. We discussed many lifestyle modifications today in depth, and [name] will continue to work on decreasing saturated fats such as fatty red meat, butter and many fried foods. She will have labs checked, increase vegetables and lean protein in her diet, and continue to work on exercise and weight loss efforts.  Cardiovascular risk counseling [name] was given extended (15 minutes) coronary artery disease prevention counseling today. She is 58 y.o. female and has risk factors for heart disease including obesity. We discussed intensive lifestyle modifications today with an emphasis on specific weight loss instructions and strategies. Pt was also informed of the importance of increasing exercise and decreasing saturated fats to help prevent heart disease.  Insulin Resistance [name] will continue to work on weight loss, exercise, and decreasing simple carbohydrates in her diet to help decrease the risk of diabetes. We dicussed metformin including benefits  and risks. She was informed that eating too many simple carbohydrates or too many calories at one sitting increases the likelihood of GI side effects. [name] will continue with weight loss and follow-up with [name] as directed to  monitor her progress.  Obesity [name] is currently in the action stage of change. As such, her goal is to continue with weight loss efforts. She has agreed to follow the Category 2 plan. [name] has been instructed to work up to a goal of 150 minutes of combined cardio and strengthening exercise per week for weight loss and overall health benefits. We discussed the following Behavioral Modification Strategies today: work on meal planning and easy cooking plans, and keeping healthy foods in the home.  [name] has agreed to follow-up with our clinic in 3 weeks. She was informed of the importance of frequent follow-up visits to maximize her success with intensive lifestyle modifications for her multiple health conditions.  ALLERGIES: Allergies  Allergen Reactions   Tape Other (See Comments)    Blisters and makes her skin peel     Ciprofloxacin Nausea Only    MEDICATIONS: Current Outpatient Medications on File Prior to Visit  Medication Sig Dispense Refill   atorvastatin (LIPITOR) 10 MG tablet Take 10 mg by mouth daily.     Calcium Carb-Cholecalciferol (CALCIUM + D3) 600-200 MG-UNIT TABS Take 1 tablet by mouth daily.     cholecalciferol (VITAMIN D3) 25 MCG (1000 UT) tablet Take 5,000 Units by mouth daily.      esomeprazole (NEXIUM) 20 MG capsule Take 20 mg by mouth daily at 12 noon.     furosemide (LASIX) 40 MG tablet Take 40 mg by mouth daily.  3   Multiple Vitamin (MULTIVITAMIN WITH MINERALS) TABS tablet Take 1 tablet by mouth daily.     nebivolol (BYSTOLIC) 5 MG tablet Take 5 mg by mouth daily.     olmesartan (BENICAR) 20 MG tablet TAKE 1 (ONE) TABLET DAILY  5   No current facility-administered medications on file prior to visit.     PAST MEDICAL HISTORY: Past Medical History:  Diagnosis Date   Barrett esophagus    Cancer ([place])    basal cell on scalp    Edema    GERD (gastroesophageal reflux disease)    takes nexium     Hyperlipidemia    Hypertension     Sleep apnea    Swelling     PAST SURGICAL HISTORY: Past Surgical History:  Procedure Laterality Date   ABDOMINAL HYSTERECTOMY     BASAL CELL CARCINOMA EXCISION  2016   CESAREAN SECTION     c/s times two    LAPAROSCOPIC VAGINAL HYSTERECTOMY WITH SALPINGECTOMY Bilateral 04/12/2015   Procedure: LAPAROSCOPIC ASSISTED VAGINAL HYSTERECTOMY WITH SALPINGECTOMY;  Surgeon: Eldred Manges, MD;  Location: [facility];  Service: Gynecology;  Laterality: Bilateral;   NASAL SEPTUM SURGERY      SOCIAL HISTORY: Social History   Tobacco Use   Smoking status: Never Smoker   Smokeless tobacco: Never Used  Substance Use Topics   Alcohol use: Yes    Comment: occasional    Drug use: No    FAMILY HISTORY: Family History  Problem Relation Age of Onset   Hypertension Mother    Hyperlipidemia Mother    Heart disease Mother    Thyroid disease Mother    Obesity Mother    Cancer Father    Stroke Father    Obesity Father    Cancer Sister    Parkinsonism Maternal Grandmother  Parkinsonism Maternal Grandfather    Cancer Paternal Grandmother    Breast cancer Neg Hx    ROS: Review of Systems  Cardiovascular: Negative for chest pain.  Gastrointestinal: Negative for nausea and vomiting.  Musculoskeletal:       Negative for muscle weakness.  Endo/Heme/Allergies:       Negative for polyphagia.   PHYSICAL EXAM: Blood pressure 98/65, pulse 72, temperature 98.1 F (36.7 C), height 5\' 10"  (1.778 m), weight 235 lb (106.6 kg), SpO2 95 %. Body mass index is 33.72 kg/m. Physical Exam Vitals signs reviewed.  Constitutional:      Appearance: Normal appearance. She is obese.  Cardiovascular:     Rate and Rhythm: Normal rate.     Pulses: Normal pulses.  Pulmonary:     Effort: Pulmonary effort is normal.     Breath sounds: Normal breath sounds.  Musculoskeletal: Normal range of motion.  Skin:    General: Skin is warm and dry.  Neurological:     Mental Status: She is alert  and oriented to person, place, and time.  Psychiatric:        Behavior: Behavior normal.   RECENT LABS AND TESTS: BMET    Component Value Date/Time   NA 142 06/12/2018 1314   K 4.2 06/12/2018 1314   CL 100 06/12/2018 1314   CO2 22 06/12/2018 1314   GLUCOSE 88 06/12/2018 1314   GLUCOSE 98 04/12/2015 0835   BUN 14 06/12/2018 1314   CREATININE 0.92 06/12/2018 1314   CALCIUM 9.8 06/12/2018 1314   GFRNONAA 70 06/12/2018 1314   GFRAA 80 06/12/2018 1314   Lab Results  Component Value Date   HGBA1C 5.5 06/12/2018   Lab Results  Component Value Date   INSULIN 19.6 06/12/2018   CBC    Component Value Date/Time   WBC 8.9 06/12/2018 1314   WBC 15.5 (H) 04/13/2015 0533   RBC 4.48 06/12/2018 1314   RBC 3.50 (L) 04/13/2015 0533   HGB 13.4 06/12/2018 1314   HCT 39.3 06/12/2018 1314   PLT 261 04/13/2015 0533   MCV 88 06/12/2018 1314   MCH 29.9 06/12/2018 1314   MCH 30.6 04/13/2015 0533   MCHC 34.1 06/12/2018 1314   MCHC 34.1 04/13/2015 0533   RDW 12.3 06/12/2018 1314   LYMPHSABS 2.7 06/12/2018 1314   EOSABS 0.3 06/12/2018 1314   BASOSABS 0.1 06/12/2018 1314   Iron/TIBC/Ferritin/ %Sat No results found for: IRON, TIBC, FERRITIN, IRONPCTSAT Lipid Panel     Component Value Date/Time   CHOL 181 06/12/2018 1314   TRIG 145 06/12/2018 1314   HDL 50 06/12/2018 1314   LDLCALC 102 (H) 06/12/2018 1314   Hepatic Function Panel     Component Value Date/Time   PROT 7.2 06/12/2018 1314   ALBUMIN 4.4 06/12/2018 1314   AST 35 06/12/2018 1314   ALT 48 (H) 06/12/2018 1314   ALKPHOS 107 06/12/2018 1314   BILITOT 0.4 06/12/2018 1314      Component Value Date/Time   TSH 3.950 06/12/2018 1314   TSH 1.806 04/13/2015 0623   Results for DAMILOLA, FLAMM (MRN [id number]) as of 12/16/2018 11:23  Ref. Range 06/12/2018 13:14  Vitamin D, 25-Hydroxy Latest Ref Range: 30.0 - 100.0 ng/mL 43.6   OBESITY BEHAVIORAL INTERVENTION VISIT  Today's visit was #10   Starting weight: 258  lbs Starting date: 06/12/2018 Today's weight: 235 lbs  Today's date: 12/16/2018 Total lbs lost to date: 23    12/16/2018  Height 5\' 10"  (1.778 m)  Weight  235 lb (106.6 kg)  BMI (Calculated) 33.72  BLOOD PRESSURE - SYSTOLIC 98  BLOOD PRESSURE - DIASTOLIC 65   Body Fat % 94.8 %  Total Body Water (lbs) 88.01 lbs   ASK: We discussed the diagnosis of obesity with [name] today and Kalaya agreed to give [name] permission to discuss obesity behavioral modification therapy today.  ASSESS: Anayla has the diagnosis of obesity and her BMI today is 33.8. Estella is in the action stage of change.   ADVISE: Derenda was educated on the multiple health risks of obesity as well as the benefit of weight loss to improve her health. She was advised of the need for long term treatment and the importance of lifestyle modifications to improve her current health and to decrease her risk of future health problems.  AGREE: Multiple dietary modification options and treatment options were discussed and  Khyla agreed to follow the recommendations documented in the above note.  ARRANGE: Christinamarie was educated on the importance of frequent visits to treat obesity as outlined per CMS and USPSTF guidelines and agreed to schedule her next follow up appointment today.  Migdalia Dk, am acting as transcriptionist for Abby Potash, PA-C I, Abby Potash, PA-C have reviewed above note and agree with its content

## 2018-12-17 LAB — LIPID PANEL WITH LDL/HDL RATIO
Cholesterol, Total: 170 mg/dL (ref 100–199)
HDL: 51 mg/dL (ref >39)
LDL Calculated: 96 mg/dL (ref 0–99)
LDl/HDL Ratio: 1.9 ratio (ref 0.0–3.2)
Triglycerides: 114 mg/dL (ref 0–149)
VLDL Cholesterol Cal: 23 mg/dL (ref 5–40)

## 2018-12-17 LAB — COMPREHENSIVE METABOLIC PANEL
ALT: 20 IU/L (ref 0–32)
AST: 17 IU/L (ref 0–40)
Albumin/Globulin Ratio: 1.9 (ref 1.2–2.2)
Albumin: 4.6 g/dL (ref 3.8–4.9)
Alkaline Phosphatase: 97 IU/L (ref 39–117)
BUN/Creatinine Ratio: 21 (ref 9–23)
BUN: 18 mg/dL (ref 6–24)
Bilirubin Total: 0.3 mg/dL (ref 0.0–1.2)
CO2: 25 mmol/L (ref 20–29)
Calcium: 10.2 mg/dL (ref 8.7–10.2)
Chloride: 100 mmol/L (ref 96–106)
Creatinine, Ser: 0.86 mg/dL (ref 0.57–1.00)
GFR calc Af Amer: 87 mL/min/{1.73_m2} (ref >59)
GFR calc non Af Amer: 76 mL/min/{1.73_m2} (ref >59)
Globulin, Total: 2.4 g/dL (ref 1.5–4.5)
Glucose: 89 mg/dL (ref 65–99)
Potassium: 4.4 mmol/L (ref 3.5–5.2)
Sodium: 140 mmol/L (ref 134–144)
Total Protein: 7 g/dL (ref 6.0–8.5)

## 2018-12-17 LAB — VITAMIN D 25 HYDROXY (VIT D DEFICIENCY, FRACTURES): Vit D, 25-Hydroxy: 56.3 ng/mL (ref 30.0–100.0)

## 2018-12-17 LAB — INSULIN, RANDOM: INSULIN: 22.3 u[IU]/mL (ref 2.6–24.9)

## 2018-12-17 LAB — HEMOGLOBIN A1C
Est. average glucose Bld gHb Est-mCnc: 105 mg/dL
Hgb A1c MFr Bld: 5.3 % (ref 4.8–5.6)

## 2019-01-07 ENCOUNTER — Other Ambulatory Visit: Payer: Self-pay

## 2019-01-07 ENCOUNTER — Ambulatory Visit (INDEPENDENT_AMBULATORY_CARE_PROVIDER_SITE_OTHER): Payer: Managed Care, Other (non HMO) | Admitting: Physician Assistant

## 2019-01-07 VITALS — BP 110/73 | HR 72 | Temp 98.0°F | Ht 70.0 in | Wt 239.0 lb

## 2019-01-07 DIAGNOSIS — I10 Essential (primary) hypertension: Secondary | ICD-10-CM | POA: Diagnosis not present

## 2019-01-07 DIAGNOSIS — Z6834 Body mass index (BMI) 34.0-34.9, adult: Secondary | ICD-10-CM

## 2019-01-07 DIAGNOSIS — E669 Obesity, unspecified: Secondary | ICD-10-CM | POA: Diagnosis not present

## 2019-01-07 NOTE — Progress Notes (Signed)
Office: [phone]  /  Fax: [phone]   HPI:   Chief Complaint: OBESITY [name] is here to discuss her progress with her obesity treatment plan. She is on the  follow the Category 2 plan and is following her eating plan approximately 60 % of the time. She states she is exercising by walking for 30 minutes 3 times per week. [name] reports that she stopped her furosemide based on recommendation from her PCP. She is now retaining a lot of fluid and is uncomfortable. She is traveling to [facility]. [employer].   Her weight is 239 lb (108.4 kg) today and has had a weight gain of 4 pounds over a period of 3 weeks since her last visit. She has lost 19 lbs since starting treatment with [name].  Hypertension [name] is a 58 y.o. female with hypertension.  [name] denies chest pain or shortness of breath on exertion. She is working weight loss to help control her blood pressure with the goal of decreasing her risk of heart attack and stroke. [name] blood pressure is currently controlled. She is currently taking olmesartan and bystolic. She is off the furosemide.     ASSESSMENT AND PLAN:  Essential hypertension  Class 1 obesity with serious comorbidity and body mass index (BMI) of 34.0 to 34.9 in adult, unspecified obesity type  PLAN: Hypertension We discussed sodium restriction, working on healthy weight loss, and a regular exercise program as the means to achieve improved blood pressure control. Veida agreed with this plan and agreed to follow up as directed. We will continue to monitor her blood pressure as well as her progress with the above lifestyle modifications. She will continue her medications as prescribed and will watch for signs of hypotension as she continues her lifestyle modifications.  I spent > than 50% of the 25 minute visit on counseling as documented in the note.  Obesity [name] is currently in the action stage of change. As such, her goal is to continue  with weight loss efforts She has agreed to follow the Category 2 plan [name] has been instructed to work up to a goal of 150 minutes of combined cardio and strengthening exercise per week for weight loss and overall health benefits. We discussed the following Behavioral Modification Stratagies today: work on meal planning and easy cooking plans and keeping healthy foods in the home.    [name] has agreed to follow up with our clinic in 2-3 weeks. She was informed of the importance of frequent follow up visits to maximize her success with intensive lifestyle modifications for her multiple health conditions.  I spent > than 50% of the 25 minute visit on counseling as documented in the note.    ALLERGIES: Allergies  Allergen Reactions  . Tape Other (See Comments)    Blisters and makes her skin peel    . Ciprofloxacin Nausea Only    MEDICATIONS: Current Outpatient Medications on File Prior to Visit  Medication Sig Dispense Refill  . atorvastatin (LIPITOR) 10 MG tablet Take 10 mg by mouth daily.    . Calcium Carb-Cholecalciferol (CALCIUM + D3) 600-200 MG-UNIT TABS Take 1 tablet by mouth daily.    . cholecalciferol (VITAMIN D3) 25 MCG (1000 UT) tablet Take 5,000 Units by mouth daily.     Marland Kitchen esomeprazole (NEXIUM) 20 MG capsule Take 20 mg by mouth daily at 12 noon.    . Multiple Vitamin (MULTIVITAMIN WITH MINERALS) TABS tablet Take 1 tablet by mouth daily.    Marland Kitchen  nebivolol (BYSTOLIC) 5 MG tablet Take 5 mg by mouth daily.    Marland Kitchen olmesartan (BENICAR) 20 MG tablet TAKE 1 (ONE) TABLET DAILY  5   No current facility-administered medications on file prior to visit.     PAST MEDICAL HISTORY: Past Medical History:  Diagnosis Date  . Barrett esophagus   . Cancer ([place])    basal cell on scalp   . Edema   . GERD (gastroesophageal reflux disease)    takes nexium    . Hyperlipidemia   . Hypertension   . Sleep apnea   . Swelling     PAST SURGICAL HISTORY: Past Surgical History:  Procedure  Laterality Date  . ABDOMINAL HYSTERECTOMY    . BASAL CELL CARCINOMA EXCISION  2016  . CESAREAN SECTION     c/s times two   . LAPAROSCOPIC VAGINAL HYSTERECTOMY WITH SALPINGECTOMY Bilateral 04/12/2015   Procedure: LAPAROSCOPIC ASSISTED VAGINAL HYSTERECTOMY WITH SALPINGECTOMY;  Surgeon: Eldred Manges, MD;  Location: [facility];  Service: Gynecology;  Laterality: Bilateral;  . NASAL SEPTUM SURGERY      SOCIAL HISTORY: Social History   Tobacco Use  . Smoking status: Never Smoker  . Smokeless tobacco: Never Used  Substance Use Topics  . Alcohol use: Yes    Comment: occasional   . Drug use: No    FAMILY HISTORY: Family History  Problem Relation Age of Onset  . Hypertension Mother   . Hyperlipidemia Mother   . Heart disease Mother   . Thyroid disease Mother   . Obesity Mother   . Cancer Father   . Stroke Father   . Obesity Father   . Cancer Sister   . Parkinsonism Maternal Grandmother   . Parkinsonism Maternal Grandfather   . Cancer Paternal Grandmother   . Breast cancer Neg Hx     ROS: Review of Systems  Constitutional: Negative for weight loss.  Respiratory: Negative for shortness of breath.   Cardiovascular: Negative for chest pain.    PHYSICAL EXAM: Blood pressure 110/73, pulse 72, temperature 98 F (36.7 C), height 5\' 10"  (1.778 m), weight 239 lb (108.4 kg), SpO2 96 %. Body mass index is 34.29 kg/m. Physical Exam Vitals signs reviewed.  Constitutional:      Appearance: Normal appearance. She is obese.  HENT:     Head: Normocephalic.     Nose: Nose normal.  Neck:     Musculoskeletal: Normal range of motion.  Cardiovascular:     Rate and Rhythm: Normal rate.  Pulmonary:     Effort: Pulmonary effort is normal.  Musculoskeletal: Normal range of motion.  Skin:    General: Skin is warm and dry.  Neurological:     Mental Status: She is oriented to person, place, and time.  Psychiatric:        Mood and Affect: Mood normal.        Behavior: Behavior normal.      RECENT LABS AND TESTS: BMET    Component Value Date/Time   NA 140 12/16/2018 1206   K 4.4 12/16/2018 1206   CL 100 12/16/2018 1206   CO2 25 12/16/2018 1206   GLUCOSE 89 12/16/2018 1206   GLUCOSE 98 04/12/2015 0835   BUN 18 12/16/2018 1206   CREATININE 0.86 12/16/2018 1206   CALCIUM 10.2 12/16/2018 1206   GFRNONAA 76 12/16/2018 1206   GFRAA 87 12/16/2018 1206   Lab Results  Component Value Date   HGBA1C 5.3 12/16/2018   HGBA1C 5.5 06/12/2018   Lab Results  Component Value Date   INSULIN 22.3 12/16/2018   INSULIN 19.6 06/12/2018   CBC    Component Value Date/Time   WBC 8.9 06/12/2018 1314   WBC 15.5 (H) 04/13/2015 0533   RBC 4.48 06/12/2018 1314   RBC 3.50 (L) 04/13/2015 0533   HGB 13.4 06/12/2018 1314   HCT 39.3 06/12/2018 1314   PLT 261 04/13/2015 0533   MCV 88 06/12/2018 1314   MCH 29.9 06/12/2018 1314   MCH 30.6 04/13/2015 0533   MCHC 34.1 06/12/2018 1314   MCHC 34.1 04/13/2015 0533   RDW 12.3 06/12/2018 1314   LYMPHSABS 2.7 06/12/2018 1314   EOSABS 0.3 06/12/2018 1314   BASOSABS 0.1 06/12/2018 1314   Iron/TIBC/Ferritin/ %Sat No results found for: IRON, TIBC, FERRITIN, IRONPCTSAT Lipid Panel     Component Value Date/Time   CHOL 170 12/16/2018 1206   TRIG 114 12/16/2018 1206   HDL 51 12/16/2018 1206   LDLCALC 96 12/16/2018 1206   Hepatic Function Panel     Component Value Date/Time   PROT 7.0 12/16/2018 1206   ALBUMIN 4.6 12/16/2018 1206   AST 17 12/16/2018 1206   ALT 20 12/16/2018 1206   ALKPHOS 97 12/16/2018 1206   BILITOT 0.3 12/16/2018 1206      Component Value Date/Time   TSH 3.950 06/12/2018 1314   TSH 1.806 04/13/2015 0623      OBESITY BEHAVIORAL INTERVENTION VISIT  Today's visit was # 11   Starting weight: 258 lbs Starting date: 06/12/18 Today's weight : Weight: 239 lb (108.4 kg)  Today's date: 01/07/2019 Total lbs lost to date: 19 lbs At least 15 minutes were spent on discussing the following behavioral intervention  visit.   ASK: We discussed the diagnosis of obesity with [name] today and [name] agreed to give [name] permission to discuss obesity behavioral modification therapy today.  ASSESS: [name] has the diagnosis of obesity and her BMI today is 34.29 Welda is in the action stage of change   ADVISE: Monzerrat was educated on the multiple health risks of obesity as well as the benefit of weight loss to improve her health. She was advised of the need for long term treatment and the importance of lifestyle modifications to improve her current health and to decrease her risk of future health problems.  AGREE: Multiple dietary modification options and treatment options were discussed and  [name] agreed to follow the recommendations documented in the above note.  ARRANGE: [name] was educated on the importance of frequent visits to treat obesity as outlined per CMS and USPSTF guidelines and agreed to schedule her next follow up appointment today.  I, Renee Ramus, am acting as Location manager for [employer], PA-C

## 2019-01-21 ENCOUNTER — Ambulatory Visit (INDEPENDENT_AMBULATORY_CARE_PROVIDER_SITE_OTHER): Payer: Managed Care, Other (non HMO) | Admitting: Physician Assistant

## 2019-01-21 ENCOUNTER — Other Ambulatory Visit: Payer: Self-pay

## 2019-01-21 ENCOUNTER — Encounter (INDEPENDENT_AMBULATORY_CARE_PROVIDER_SITE_OTHER): Payer: Self-pay | Admitting: Physician Assistant

## 2019-01-21 VITALS — BP 111/68 | HR 75 | Temp 98.2°F | Ht 70.0 in | Wt 238.0 lb

## 2019-01-21 DIAGNOSIS — E669 Obesity, unspecified: Secondary | ICD-10-CM

## 2019-01-21 DIAGNOSIS — I10 Essential (primary) hypertension: Secondary | ICD-10-CM | POA: Diagnosis not present

## 2019-01-21 DIAGNOSIS — R600 Localized edema: Secondary | ICD-10-CM

## 2019-01-21 DIAGNOSIS — Z9189 Other specified personal risk factors, not elsewhere classified: Secondary | ICD-10-CM | POA: Diagnosis not present

## 2019-01-21 DIAGNOSIS — Z6834 Body mass index (BMI) 34.0-34.9, adult: Secondary | ICD-10-CM

## 2019-01-22 NOTE — Progress Notes (Signed)
Office: [phone]  /  Fax: [phone]   HPI:   Chief Complaint: OBESITY [name] is here to discuss her progress with her obesity treatment plan. She is on the Category 2 plan and is following her eating plan approximately 70 % of the time. She states she is walking 30 minutes 3 to 4 times per week. [name] reports that she has been following the plan closely but she is eating 300 snack calories. [name] is frustrated with the lack of weight loss. Her weight is 238 lb (108 kg) today and has had a weight loss of 1 pound over a period of 2 weeks since her last visit. She has lost 20 lbs since starting treatment with [name].  Bilateral Lower Extremity Edema [name] stopped furosemide based on the recommendation by her PCP, but she has been having swelling in her lower extremities bilaterally for the last three weeks since stopping the medication.  Hypertension [name] is a 58 y.o. female with hypertension. 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 [name] denies chest pain. She is working weight loss to help control her blood pressure with the goal of decreasing her risk of heart attack and stroke. Sandras blood pressure is normal.  At risk for cardiovascular disease [name] is at a higher than average risk for cardiovascular disease due to obesity and hypertension. She currently denies any chest pain.  ASSESSMENT AND PLAN:  Bilateral lower extremity edema  Essential hypertension  At risk for heart disease  Class 1 obesity with serious comorbidity and body mass index (BMI) of 34.0 to 34.9 in adult, unspecified obesity type  PLAN:  Bilateral Lower Extremity Edema [name] will restart furosemide 20 mg 1/2 tablet 2 times daily (no refill needed) and follow up as directed.  Hypertension We discussed sodium restriction, working on healthy weight loss, and a regular exercise program as the means to achieve improved blood pressure control. [name] agreed with this plan and agreed to  follow up as directed. We will continue to monitor her blood pressure as well as her progress with the above lifestyle modifications. She will continue her medications as prescribed and will watch for signs of hypotension as she continues her lifestyle modifications.  Cardiovascular risk counseling [name] was given extended (15 minutes) coronary artery disease prevention counseling today. She is 59 y.o. female and has risk factors for heart disease including obesity and hypertension. We discussed intensive lifestyle modifications today with an emphasis on specific weight loss instructions and strategies. Pt was also informed of the importance of increasing exercise and decreasing saturated fats to help prevent heart disease.  Obesity [name] is currently in the action stage of change. As such, her goal is to continue with weight loss efforts She has agreed to follow the Category 2 plan +1 cup of [employer] milk and 1/2 cup cottage cheese [name] has been instructed to work up to a goal of 150 minutes of combined cardio and strengthening exercise per week for weight loss and overall health benefits. We discussed the following Behavioral Modification Strategies today: keeping healthy foods in the home and work on meal planning and easy cooking plans  We will recheck indirect calorimetry at the next visit if needed.   [name] has agreed to follow up with our clinic in 2 weeks. She was informed of the importance of frequent follow up visits to maximize her success with intensive lifestyle modifications for her multiple health conditions.  ALLERGIES: Allergies  Allergen Reactions   Tape Other (See Comments)  Blisters and makes her skin peel     Ciprofloxacin Nausea Only    MEDICATIONS: Current Outpatient Medications on File Prior to Visit  Medication Sig Dispense Refill   atorvastatin (LIPITOR) 10 MG tablet Take 10 mg by mouth daily.     Calcium Carb-Cholecalciferol (CALCIUM + D3) 600-200  MG-UNIT TABS Take 1 tablet by mouth daily.     cholecalciferol (VITAMIN D3) 25 MCG (1000 UT) tablet Take 5,000 Units by mouth daily.      esomeprazole (NEXIUM) 20 MG capsule Take 20 mg by mouth daily at 12 noon.     Multiple Vitamin (MULTIVITAMIN WITH MINERALS) TABS tablet Take 1 tablet by mouth daily.     nebivolol (BYSTOLIC) 5 MG tablet Take 5 mg by mouth daily.     olmesartan (BENICAR) 20 MG tablet TAKE 1 (ONE) TABLET DAILY  5   No current facility-administered medications on file prior to visit.     PAST MEDICAL HISTORY: Past Medical History:  Diagnosis Date   Barrett esophagus    Cancer ([place])    basal cell on scalp    Edema    GERD (gastroesophageal reflux disease)    takes nexium     Hyperlipidemia    Hypertension    Sleep apnea    Swelling     PAST SURGICAL HISTORY: Past Surgical History:  Procedure Laterality Date   ABDOMINAL HYSTERECTOMY     BASAL CELL CARCINOMA EXCISION  2016   CESAREAN SECTION     c/s times two    LAPAROSCOPIC VAGINAL HYSTERECTOMY WITH SALPINGECTOMY Bilateral 04/12/2015   Procedure: LAPAROSCOPIC ASSISTED VAGINAL HYSTERECTOMY WITH SALPINGECTOMY;  Surgeon: Eldred Manges, MD;  Location: [facility];  Service: Gynecology;  Laterality: Bilateral;   NASAL SEPTUM SURGERY      SOCIAL HISTORY: Social History   Tobacco Use   Smoking status: Never Smoker   Smokeless tobacco: Never Used  Substance Use Topics   Alcohol use: Yes    Comment: occasional    Drug use: No    FAMILY HISTORY: Family History  Problem Relation Age of Onset   Hypertension Mother    Hyperlipidemia Mother    Heart disease Mother    Thyroid disease Mother    Obesity Mother    Cancer Father    Stroke Father    Obesity Father    Cancer Sister    Parkinsonism Maternal Grandmother    Parkinsonism Maternal Grandfather    Cancer Paternal Grandmother    Breast cancer Neg Hx     ROS: Review of Systems  Constitutional: Positive for weight  loss.  Cardiovascular: Negative for chest pain.  Musculoskeletal:       Positive for edema (bilateral lower extremities)    PHYSICAL EXAM: Blood pressure 111/68, pulse 75, temperature 98.2 F (36.8 C), temperature source Oral, height 5\' 10"  (1.778 m), weight 238 lb (108 kg), SpO2 97 %. Body mass index is 34.15 kg/m. Physical Exam Vitals signs reviewed.  Constitutional:      Appearance: Normal appearance. She is well-developed. She is obese.  Cardiovascular:     Rate and Rhythm: Normal rate.  Pulmonary:     Effort: Pulmonary effort is normal.  Musculoskeletal: Normal range of motion.     Right lower leg: Edema present.     Left lower leg: Edema present.  Skin:    General: Skin is warm and dry.  Neurological:     Mental Status: She is alert and oriented to person, place, and time.  Psychiatric:  Mood and Affect: Mood normal.        Behavior: Behavior normal.     RECENT LABS AND TESTS: BMET    Component Value Date/Time   NA 140 12/16/2018 1206   K 4.4 12/16/2018 1206   CL 100 12/16/2018 1206   CO2 25 12/16/2018 1206   GLUCOSE 89 12/16/2018 1206   GLUCOSE 98 04/12/2015 0835   BUN 18 12/16/2018 1206   CREATININE 0.86 12/16/2018 1206   CALCIUM 10.2 12/16/2018 1206   GFRNONAA 76 12/16/2018 1206   GFRAA 87 12/16/2018 1206   Lab Results  Component Value Date   HGBA1C 5.3 12/16/2018   HGBA1C 5.5 06/12/2018   Lab Results  Component Value Date   INSULIN 22.3 12/16/2018   INSULIN 19.6 06/12/2018   CBC    Component Value Date/Time   WBC 8.9 06/12/2018 1314   WBC 15.5 (H) 04/13/2015 0533   RBC 4.48 06/12/2018 1314   RBC 3.50 (L) 04/13/2015 0533   HGB 13.4 06/12/2018 1314   HCT 39.3 06/12/2018 1314   PLT 261 04/13/2015 0533   MCV 88 06/12/2018 1314   MCH 29.9 06/12/2018 1314   MCH 30.6 04/13/2015 0533   MCHC 34.1 06/12/2018 1314   MCHC 34.1 04/13/2015 0533   RDW 12.3 06/12/2018 1314   LYMPHSABS 2.7 06/12/2018 1314   EOSABS 0.3 06/12/2018 1314    BASOSABS 0.1 06/12/2018 1314   Iron/TIBC/Ferritin/ %Sat No results found for: IRON, TIBC, FERRITIN, IRONPCTSAT Lipid Panel     Component Value Date/Time   CHOL 170 12/16/2018 1206   TRIG 114 12/16/2018 1206   HDL 51 12/16/2018 1206   LDLCALC 96 12/16/2018 1206   Hepatic Function Panel     Component Value Date/Time   PROT 7.0 12/16/2018 1206   ALBUMIN 4.6 12/16/2018 1206   AST 17 12/16/2018 1206   ALT 20 12/16/2018 1206   ALKPHOS 97 12/16/2018 1206   BILITOT 0.3 12/16/2018 1206      Component Value Date/Time   TSH 3.950 06/12/2018 1314   TSH 1.806 04/13/2015 0623     Ref. Range 12/16/2018 12:06  Vitamin D, 25-Hydroxy Latest Ref Range: 30.0 - 100.0 ng/mL 56.3    OBESITY BEHAVIORAL INTERVENTION VISIT  Today's visit was # 12   Starting weight: 258 lbs Starting date: 06/12/2018 Today's weight : 238 lbs Today's date: 01/21/2019 Total lbs lost to date: 20    01/21/2019  Height 5\' 10"  (1.778 m)  Weight 238 lb (108 kg)  BMI (Calculated) 34.15  BLOOD PRESSURE - SYSTOLIC [id number]  BLOOD PRESSURE - DIASTOLIC 68   Body Fat % [id number] %  Total Body Water (lbs) 91.8 lbs    ASK: We discussed the diagnosis of obesity with [name] today and Katharine Look agreed to give [name] permission to discuss obesity behavioral modification therapy today.  ASSESS: Laiya has the diagnosis of obesity and her BMI today is 34.15 Whitney is in the action stage of change   ADVISE: Jazya was educated on the multiple health risks of obesity as well as the benefit of weight loss to improve her health. She was advised of the need for long term treatment and the importance of lifestyle modifications to improve her current health and to decrease her risk of future health problems.  AGREE: Multiple dietary modification options and treatment options were discussed and  Sabina agreed to follow the recommendations documented in the above note.  ARRANGE: Katria was educated on the importance of frequent visits to  treat obesity  as outlined per CMS and USPSTF guidelines and agreed to schedule her next follow up appointment today.  Corey Skains, am acting as transcriptionist for Abby Potash, PA-C I, Abby Potash, PA-C have reviewed above note and agree with its content

## 2019-02-04 ENCOUNTER — Ambulatory Visit (INDEPENDENT_AMBULATORY_CARE_PROVIDER_SITE_OTHER): Payer: Managed Care, Other (non HMO) | Admitting: Cardiology

## 2019-02-04 ENCOUNTER — Encounter: Payer: Self-pay | Admitting: Cardiology

## 2019-02-04 ENCOUNTER — Ambulatory Visit: Payer: Self-pay | Admitting: Cardiology

## 2019-02-04 ENCOUNTER — Other Ambulatory Visit: Payer: Self-pay

## 2019-02-04 VITALS — BP 140/67 | HR 72 | Temp 97.5°F | Ht 70.0 in | Wt 241.0 lb

## 2019-02-04 DIAGNOSIS — I10 Essential (primary) hypertension: Secondary | ICD-10-CM

## 2019-02-04 DIAGNOSIS — I5189 Other ill-defined heart diseases: Secondary | ICD-10-CM | POA: Diagnosis not present

## 2019-02-04 DIAGNOSIS — E782 Mixed hyperlipidemia: Secondary | ICD-10-CM

## 2019-02-04 DIAGNOSIS — E669 Obesity, unspecified: Secondary | ICD-10-CM

## 2019-02-04 NOTE — Progress Notes (Signed)
Primary Physician:  Crist Infante, MD   Patient ID: [name], female    DOB: [birth year], 58 y.o.   MRN: [id number]  Subjective:    Chief Complaint  Patient presents with  . Hypertension  . Follow-up    1 year    HPI: [name]  is a 58 y.o. female  With hypertension, hyperlipidemia, chronic leg edema, and diastolic dysfunction. She presents for 1 year follow up.   She has history of edema on the legs for many years. Patient says that edema started initially about 31 years ago during her pregnancy when she had toxemia. Subsequently, she has had mild swelling on the legs off and on. However, the swelling had increased significantly in 2018. Now, it has improved markedly with furosemide therapy. Venous study on 04/26/2017 did not reveal any evidence of superficial venous insufficiency in lower extremities and there was no DVT. Patient was seen by her gynecologist and was told that probably worsening of edema was due to menopause. (Patient had hysterectomy in the past but ovaries were not removed).  Since last seen by Dr. Woody Seller, 1 year ago, she has recently cut out sugar out of her diet and her leg edema has essentially resolved. She is overall feeling much better with the weight loss.   No complaints of shortness of breath, orthopnea or PND. No chest pain, tightness or pressure. No palpitation, dizziness, near-syncope or syncope.  She has history of sleep apnea, uses CPAP regularly. She walks 3 miles a day, usually 5 days a week.  No history of thyroid problems. Serum TSH recently was 3.24. No history of TIA or CVA.  Past Medical History:  Diagnosis Date  . Barrett esophagus   . Cancer ([place])    basal cell on scalp   . Edema   . GERD (gastroesophageal reflux disease)    takes nexium    . Hyperlipidemia   . Hypertension   . Sleep apnea   . Swelling     Past Surgical History:  Procedure Laterality Date  . ABDOMINAL HYSTERECTOMY    . BASAL CELL CARCINOMA  EXCISION  2016  . CESAREAN SECTION     c/s times two   . LAPAROSCOPIC VAGINAL HYSTERECTOMY WITH SALPINGECTOMY Bilateral 04/12/2015   Procedure: LAPAROSCOPIC ASSISTED VAGINAL HYSTERECTOMY WITH SALPINGECTOMY;  Surgeon: Eldred Manges, MD;  Location: [facility];  Service: Gynecology;  Laterality: Bilateral;  . NASAL SEPTUM SURGERY      Social History   Socioeconomic History  . Marital status: Married    Spouse name: Reatha Taube  . Number of children: 2  . Years of education: Not on file  . Highest education level: Not on file  Occupational History  . Occupation: Insurance underwriter  Social Needs  . Financial resource strain: Not on file  . Food insecurity    Worry: Not on file    Inability: Not on file  . Transportation needs    Medical: Not on file    Non-medical: Not on file  Tobacco Use  . Smoking status: Never Smoker  . Smokeless tobacco: Never Used  Substance and Sexual Activity  . Alcohol use: Yes    Comment: occasional   . Drug use: No  . Sexual activity: Yes    Birth control/protection: None    Comment: Ablation  Lifestyle  . Physical activity    Days per week: Not on file    Minutes per session: Not on file  . Stress: Not on file  Relationships  . Social Herbalist on phone: Not on file    Gets together: Not on file    Attends religious service: Not on file    Active member of club or organization: Not on file    Attends meetings of clubs or organizations: Not on file    Relationship status: Not on file  . Intimate partner violence    Fear of current or ex partner: Not on file    Emotionally abused: Not on file    Physically abused: Not on file    Forced sexual activity: Not on file  Other Topics Concern  . Not on file  Social History Narrative  . Not on file    Review of Systems  Constitution: Negative for decreased appetite, malaise/fatigue, weight gain and weight loss.  Eyes: Negative for visual disturbance.  Cardiovascular: Positive for  leg swelling (improved, now intermittent). Negative for chest pain, claudication, dyspnea on exertion, orthopnea, palpitations and syncope.  Respiratory: Negative for hemoptysis and wheezing.   Endocrine: Negative for cold intolerance and heat intolerance.  Hematologic/Lymphatic: Does not bruise/bleed easily.  Skin: Negative for nail changes.  Musculoskeletal: Negative for muscle weakness and myalgias.  Gastrointestinal: Negative for abdominal pain, change in bowel habit, nausea and vomiting.  Neurological: Negative for difficulty with concentration, dizziness, focal weakness and headaches.  Psychiatric/Behavioral: Negative for altered mental status and suicidal ideas.  All other systems reviewed and are negative.     Objective:  Blood pressure 140/67, pulse 72, temperature (!) 97.5 F (36.4 C), height 5\' 10"  (1.778 m), weight 241 lb (109.3 kg), SpO2 98 %. Body mass index is 34.58 kg/m.    Physical Exam  Constitutional: She is oriented to person, place, and time. Vital signs are normal. She appears well-developed and well-nourished.  HENT:  Head: Normocephalic and atraumatic.  Neck: Normal range of motion.  Cardiovascular: Normal rate, regular rhythm, normal heart sounds and intact distal pulses.  Pulmonary/Chest: Effort normal and breath sounds normal. No accessory muscle usage. No respiratory distress.  Abdominal: Soft. Bowel sounds are normal.  Musculoskeletal: Normal range of motion.  Neurological: She is alert and oriented to person, place, and time.  Skin: Skin is warm and dry.  Vitals reviewed.  Radiology: No results found.  Laboratory examination:    CMP Latest Ref Rng & Units 12/16/2018 06/12/2018 04/12/2015  Glucose 65 - 99 mg/dL 89 88 98  BUN 6 - 24 mg/dL 18 14 14   Creatinine 0.57 - 1.00 mg/dL 0.86 0.92 0.84  Sodium 134 - 144 mmol/L 140 142 137  Potassium 3.5 - 5.2 mmol/L 4.4 4.2 3.6  Chloride 96 - 106 mmol/L 100 100 103  CO2 20 - 29 mmol/L 25 22 25   Calcium 8.7 -  10.2 mg/dL 10.2 9.8 9.5  Total Protein 6.0 - 8.5 g/dL 7.0 7.2 -  Total Bilirubin 0.0 - 1.2 mg/dL 0.3 0.4 -  Alkaline Phos 39 - 117 IU/L 97 107 -  AST 0 - 40 IU/L 17 35 -  ALT 0 - 32 IU/L 20 48(H) -   CBC Latest Ref Rng & Units 06/12/2018 04/13/2015 04/08/2015  WBC 3.4 - 10.8 x10E3/uL 8.9 15.5(H) 10.7(H)  Hemoglobin 11.1 - 15.9 g/dL 13.4 10.7(L) 13.6  Hematocrit 34.0 - 46.6 % 39.3 31.4(L) 39.4  Platelets 150 - 400 K/uL - 261 302   Lipid Panel     Component Value Date/Time   CHOL 170 12/16/2018 1206   TRIG 114 12/16/2018 1206   HDL 51  12/16/2018 1206   LDLCALC 96 12/16/2018 1206   HEMOGLOBIN A1C Lab Results  Component Value Date   HGBA1C 5.3 12/16/2018   TSH Recent Labs    06/12/18 1314  TSH 3.950    PRN Meds:. There are no discontinued medications. Current Meds  Medication Sig  . atorvastatin (LIPITOR) 10 MG tablet Take 10 mg by mouth daily.  . Calcium Carb-Cholecalciferol (CALCIUM + D3) 600-200 MG-UNIT TABS Take 1 tablet by mouth daily.  . cholecalciferol (VITAMIN D3) 25 MCG (1000 UT) tablet Take 5,000 Units by mouth daily.   Marland Kitchen esomeprazole (NEXIUM) 20 MG capsule Take 20 mg by mouth daily at 12 noon.  . Multiple Vitamin (MULTIVITAMIN WITH MINERALS) TABS tablet Take 1 tablet by mouth daily.  . nebivolol (BYSTOLIC) 5 MG tablet Take 5 mg by mouth daily.  Marland Kitchen olmesartan (BENICAR) 20 MG tablet TAKE 1 (ONE) TABLET DAILY    Cardiac Studies:   Echo at [facility] on 04/08/2017: reported as normal left ventricle cavity size and normal LV wall thickness. Estimated EF-60-65 percent. Grade 2 diastolic dysfunction. No other significant findings.  Assessment:   Essential hypertension - Plan: EKG [id number]  Diastolic dysfunction  Mixed hyperlipidemia  Obesity (BMI 30-39.9)  EKG 02/04/2019: Normal sinus rhythm at 67 bpm, normal axis, no evidence of ischemia.   Recommendations:   With weight loss and significant changes to her diet, leg edema has essentially resolved except  for days where she is not compliant with her diet.  Urged her to continue with this.  Congratulated her on her efforts and provided positive reinforcement. No shortness of breath or chest pain.  Blood pressure is well controlled with current medications, she actually states that her blood pressure is occasionally too low but is asymptomatic.  Have advised that if she continues to lose weight, we could eventually decrease her medications and potentially discontinue.  If needed, would recommend decreasing her dose of olmesartan down to 10 mg daily.  She reports her lipids are well controlled are being managed by her PCP.  Overall, she is doing well and I am very pleased with her progress.  I will see her back in 1 year or sooner if problems.  Miquel Dunn, MSN, APRN, FNP-C [facility]. [place] Office: [phone] Fax: [phone]

## 2019-02-05 ENCOUNTER — Encounter: Payer: Self-pay | Admitting: Physician Assistant

## 2019-02-05 ENCOUNTER — Encounter (INDEPENDENT_AMBULATORY_CARE_PROVIDER_SITE_OTHER): Payer: Self-pay | Admitting: Physician Assistant

## 2019-02-05 ENCOUNTER — Ambulatory Visit (INDEPENDENT_AMBULATORY_CARE_PROVIDER_SITE_OTHER): Payer: Managed Care, Other (non HMO) | Admitting: Physician Assistant

## 2019-02-05 VITALS — BP 125/80 | HR 65 | Temp 98.8°F | Ht 70.0 in | Wt 238.0 lb

## 2019-02-05 DIAGNOSIS — Z9189 Other specified personal risk factors, not elsewhere classified: Secondary | ICD-10-CM | POA: Diagnosis not present

## 2019-02-05 DIAGNOSIS — E7849 Other hyperlipidemia: Secondary | ICD-10-CM | POA: Diagnosis not present

## 2019-02-05 DIAGNOSIS — R0602 Shortness of breath: Secondary | ICD-10-CM | POA: Diagnosis not present

## 2019-02-05 DIAGNOSIS — Z6834 Body mass index (BMI) 34.0-34.9, adult: Secondary | ICD-10-CM

## 2019-02-05 DIAGNOSIS — E669 Obesity, unspecified: Secondary | ICD-10-CM

## 2019-02-06 NOTE — Progress Notes (Signed)
Office: [phone]  /  Fax: [phone]   HPI:   Chief Complaint: OBESITY [name] is here to discuss her progress with her obesity treatment plan. She is on the Category 2 plan and is following her eating plan approximately 70 % of the time. She states she is exercising 0 minutes 0 times per week. [name] reports that she is discouraged since she has been following the plan and is in a plateau. She is going to start exercising regularly. Her weight is 238 lb (108 kg) today and she has maintained weight since her last visit. She has lost 20 lbs since starting treatment with [name].  Hyperlipidemia [name] has hyperlipidemia and she is on Atorvastatin. She and has been trying to improve her cholesterol levels with intensive lifestyle modification including a low saturated fat diet, exercise and weight loss. She denies any chest pain.  At risk for cardiovascular disease [name] is at a higher than average risk for cardiovascular disease due to obesity and hyperlipidemia. She currently denies any chest pain.  Dyspnea with Exercise [name] notes shortness of breath on exertion and seems to be worsening over time with weight gain. She notes getting out of breath sooner with activity than she used to. She denies chest pain.  ASSESSMENT AND PLAN:  Other hyperlipidemia  Shortness of breath on exertion  At risk for heart disease  Class 1 obesity with serious comorbidity and body mass index (BMI) of 34.0 to 34.9 in adult, unspecified obesity type  PLAN:  Hyperlipidemia [name] was informed of the American Heart Association Guidelines emphasizing intensive lifestyle modifications as the first line treatment for hyperlipidemia. We discussed many lifestyle modifications today in depth, and [name] will continue to work on decreasing saturated fats such as fatty red meat, butter and many fried foods. She will also increase vegetables and lean protein in her diet and continue to work on exercise and  weight loss efforts. [name] will continue Atorvastatin and follow up as directed.  Cardiovascular risk counseling [name] was given extended (15 minutes) coronary artery disease prevention counseling today. She is 58 y.o. female and has risk factors for heart disease including obesity and hyperlipidemia. We discussed intensive lifestyle modifications today with an emphasis on specific weight loss instructions and strategies. Pt was also informed of the importance of increasing exercise and decreasing saturated fats to help prevent heart disease.  Dyspnea with exercise [name]'s shortness of breath appears to be obesity related and exercise induced. Indirect calorimetry was ordered today and the indirect calorimeter results showed VO2 of 301 and a REE of 2095. She will continue to work on weight loss and gradually increase exercise to treat her exercise induced shortness of breath.   Obesity [name] is currently in the action stage of change. As such, her goal is to continue with weight loss efforts She has agreed to go up to the Category 3 plan [name] has been instructed to work up to a goal of 150 minutes of combined cardio and strengthening exercise per week for weight loss and overall health benefits. We discussed the following Behavioral Modification Strategies today: work on meal planning and easy cooking plans  [name] has agreed to follow up with our clinic in 2 weeks. She was informed of the importance of frequent follow up visits to maximize her success with intensive lifestyle modifications for her multiple health conditions.  ALLERGIES: Allergies  Allergen Reactions   Tape Other (See Comments)    Blisters and makes her skin peel  Ciprofloxacin Nausea Only    MEDICATIONS: Current Outpatient Medications on File Prior to Visit  Medication Sig Dispense Refill   atorvastatin (LIPITOR) 10 MG tablet Take 10 mg by mouth daily.     Calcium Carb-Cholecalciferol (CALCIUM + D3)  600-200 MG-UNIT TABS Take 1 tablet by mouth daily.     cholecalciferol (VITAMIN D3) 25 MCG (1000 UT) tablet Take 5,000 Units by mouth daily.      esomeprazole (NEXIUM) 20 MG capsule Take 20 mg by mouth daily at 12 noon.     Multiple Vitamin (MULTIVITAMIN WITH MINERALS) TABS tablet Take 1 tablet by mouth daily.     nebivolol (BYSTOLIC) 5 MG tablet Take 5 mg by mouth daily.     olmesartan (BENICAR) 20 MG tablet TAKE 1 (ONE) TABLET DAILY  5   No current facility-administered medications on file prior to visit.     PAST MEDICAL HISTORY: Past Medical History:  Diagnosis Date   Barrett esophagus    Cancer ([place])    basal cell on scalp    Edema    GERD (gastroesophageal reflux disease)    takes nexium     Hyperlipidemia    Hypertension    Sleep apnea    Swelling     PAST SURGICAL HISTORY: Past Surgical History:  Procedure Laterality Date   ABDOMINAL HYSTERECTOMY     BASAL CELL CARCINOMA EXCISION  2016   CESAREAN SECTION     c/s times two    LAPAROSCOPIC VAGINAL HYSTERECTOMY WITH SALPINGECTOMY Bilateral 04/12/2015   Procedure: LAPAROSCOPIC ASSISTED VAGINAL HYSTERECTOMY WITH SALPINGECTOMY;  Surgeon: Eldred Manges, MD;  Location: [facility];  Service: Gynecology;  Laterality: Bilateral;   NASAL SEPTUM SURGERY      SOCIAL HISTORY: Social History   Tobacco Use   Smoking status: Never Smoker   Smokeless tobacco: Never Used  Substance Use Topics   Alcohol use: Yes    Comment: occasional    Drug use: No    FAMILY HISTORY: Family History  Problem Relation Age of Onset   Hypertension Mother    Hyperlipidemia Mother    Heart disease Mother    Thyroid disease Mother    Obesity Mother    Cancer Father    Stroke Father    Obesity Father    Cancer Sister    Hyperlipidemia Sister    Parkinsonism Maternal Grandmother    Parkinsonism Maternal Grandfather    Cancer Paternal Grandmother    Hyperlipidemia Sister    Hypertension Sister     Breast cancer Neg Hx     ROS: Review of Systems  Constitutional: Negative for weight loss.  Cardiovascular: Negative for chest pain.    PHYSICAL EXAM: Blood pressure 125/80, pulse 65, temperature 98.8 F (37.1 C), temperature source Oral, height 5\' 10"  (1.778 m), weight 238 lb (108 kg), SpO2 96 %. Body mass index is 34.15 kg/m. Physical Exam Vitals signs reviewed.  Constitutional:      Appearance: Normal appearance. She is well-developed. She is obese.  Cardiovascular:     Rate and Rhythm: Normal rate.  Pulmonary:     Effort: Pulmonary effort is normal.  Musculoskeletal: Normal range of motion.  Skin:    General: Skin is warm and dry.  Neurological:     Mental Status: She is alert and oriented to person, place, and time.  Psychiatric:        Mood and Affect: Mood normal.        Behavior: Behavior normal.     RECENT LABS  AND TESTS: BMET    Component Value Date/Time   NA 140 12/16/2018 1206   K 4.4 12/16/2018 1206   CL 100 12/16/2018 1206   CO2 25 12/16/2018 1206   GLUCOSE 89 12/16/2018 1206   GLUCOSE 98 04/12/2015 0835   BUN 18 12/16/2018 1206   CREATININE 0.86 12/16/2018 1206   CALCIUM 10.2 12/16/2018 1206   GFRNONAA 76 12/16/2018 1206   GFRAA 87 12/16/2018 1206   Lab Results  Component Value Date   HGBA1C 5.3 12/16/2018   HGBA1C 5.5 06/12/2018   Lab Results  Component Value Date   INSULIN 22.3 12/16/2018   INSULIN 19.6 06/12/2018   CBC    Component Value Date/Time   WBC 8.9 06/12/2018 1314   WBC 15.5 (H) 04/13/2015 0533   RBC 4.48 06/12/2018 1314   RBC 3.50 (L) 04/13/2015 0533   HGB 13.4 06/12/2018 1314   HCT 39.3 06/12/2018 1314   PLT 261 04/13/2015 0533   MCV 88 06/12/2018 1314   MCH 29.9 06/12/2018 1314   MCH 30.6 04/13/2015 0533   MCHC 34.1 06/12/2018 1314   MCHC 34.1 04/13/2015 0533   RDW 12.3 06/12/2018 1314   LYMPHSABS 2.7 06/12/2018 1314   EOSABS 0.3 06/12/2018 1314   BASOSABS 0.1 06/12/2018 1314   Iron/TIBC/Ferritin/ %Sat No  results found for: IRON, TIBC, FERRITIN, IRONPCTSAT Lipid Panel     Component Value Date/Time   CHOL 170 12/16/2018 1206   TRIG 114 12/16/2018 1206   HDL 51 12/16/2018 1206   LDLCALC 96 12/16/2018 1206   Hepatic Function Panel     Component Value Date/Time   PROT 7.0 12/16/2018 1206   ALBUMIN 4.6 12/16/2018 1206   AST 17 12/16/2018 1206   ALT 20 12/16/2018 1206   ALKPHOS 97 12/16/2018 1206   BILITOT 0.3 12/16/2018 1206      Component Value Date/Time   TSH 3.950 06/12/2018 1314   TSH 1.806 04/13/2015 0623     Ref. Range 12/16/2018 12:06  Vitamin D, 25-Hydroxy Latest Ref Range: 30.0 - 100.0 ng/mL 56.3    OBESITY BEHAVIORAL INTERVENTION VISIT  Today's visit was # 13   Starting weight: 258 lbs Starting date: 06/12/2018 Today's weight : 238 lbs Today's date: 02/05/2019 Total lbs lost to date: 20    02/05/2019  Height 5\' 10"  (1.778 m)  Weight 238 lb (108 kg)  BMI (Calculated) 34.15  BLOOD PRESSURE - SYSTOLIC 0000000  BLOOD PRESSURE - DIASTOLIC 80   Body Fat % [id number] %  Total Body Water (lbs) 90 lbs  RMR 2096     ASK: We discussed the diagnosis of obesity with [name] today and [name] agreed to give [name] permission to discuss obesity behavioral modification therapy today.  ASSESS: [name] has the diagnosis of obesity and her BMI today is 34.15 [name] is in the action stage of change   ADVISE: [name] was educated on the multiple health risks of obesity as well as the benefit of weight loss to improve her health. She was advised of the need for long term treatment and the importance of lifestyle modifications to improve her current health and to decrease her risk of future health problems.  AGREE: Multiple dietary modification options and treatment options were discussed and  [name] agreed to follow the recommendations documented in the above note.  ARRANGE: [name] was educated on the importance of frequent visits to treat obesity as outlined per CMS and USPSTF  guidelines and agreed to schedule her next follow up appointment today.  [name], am acting as transcriptionist for Abby Potash, PA-C I, Abby Potash, PA-C have reviewed above note and agree with its content

## 2019-02-07 ENCOUNTER — Encounter: Payer: Self-pay | Admitting: Cardiology

## 2019-02-19 ENCOUNTER — Ambulatory Visit (INDEPENDENT_AMBULATORY_CARE_PROVIDER_SITE_OTHER): Payer: Managed Care, Other (non HMO) | Admitting: Physician Assistant

## 2019-02-25 ENCOUNTER — Other Ambulatory Visit: Payer: Self-pay

## 2019-02-25 ENCOUNTER — Ambulatory Visit (INDEPENDENT_AMBULATORY_CARE_PROVIDER_SITE_OTHER): Payer: Managed Care, Other (non HMO) | Admitting: Physician Assistant

## 2019-02-25 ENCOUNTER — Encounter (INDEPENDENT_AMBULATORY_CARE_PROVIDER_SITE_OTHER): Payer: Self-pay | Admitting: Physician Assistant

## 2019-02-25 VITALS — BP 117/74 | HR 68 | Temp 98.5°F | Ht 70.0 in | Wt 235.0 lb

## 2019-02-25 DIAGNOSIS — E559 Vitamin D deficiency, unspecified: Secondary | ICD-10-CM

## 2019-02-25 DIAGNOSIS — E669 Obesity, unspecified: Secondary | ICD-10-CM

## 2019-02-25 DIAGNOSIS — Z6833 Body mass index (BMI) 33.0-33.9, adult: Secondary | ICD-10-CM | POA: Diagnosis not present

## 2019-02-26 NOTE — Progress Notes (Signed)
Office: [phone]  /  Fax: [phone]   HPI:   Chief Complaint: OBESITY [name] is here to discuss her progress with her obesity treatment plan. She is on the Category 3 plan and is following her eating plan approximately 80% of the time. She states she is exercising 0 minutes 0 times per week. [name] reports that she has been following the Category 3 plan well. She likes the increase in calories and protein and is feeling better. Her weight is 235 lb (106.6 kg) today and has had a weight loss of 3 pounds over a period of 3 weeks since her last visit. She has lost 23 lbs since starting treatment with [name].  Vitamin D deficiency [name] has a diagnosis of Vitamin D deficiency. She is currently taking OTC Vit D and denies nausea, vomiting or muscle weakness.  ASSESSMENT AND PLAN:  Vitamin D deficiency  Class 1 obesity with serious comorbidity and body mass index (BMI) of 33.0 to 33.9 in adult, unspecified obesity type  PLAN:  Vitamin D Deficiency [name] was informed that low Vitamin D levels contributes to fatigue and are associated with obesity, breast, and colon cancer. She agrees to continue taking Vit D and will follow-up for routine testing of Vitamin D, at least 2-3 times per year. She was informed of the risk of over-replacement of Vitamin D and agrees to not increase her dose unless she discusses this with [name] first. [name] agrees to follow-up with our clinic in 3 weeks.  I spent > than 50% of the 15 minute visit on counseling as documented in the note.  Obesity [name] is currently in the action stage of change. As such, her goal is to continue with weight loss efforts. She has agreed to follow the Category 3 plan and journal 450-600 calories and 40 grams of protein at supper. [name] has been instructed to work up to a goal of 150 minutes of combined cardio and strengthening exercise per week for weight loss and overall health benefits. We discussed the following Behavioral  Modification Strategies today: work on meal planning and easy cooking plans, and planning for success.  [name] has agreed to follow-up with our clinic in 3 weeks. She was informed of the importance of frequent follow-up visits to maximize her success with intensive lifestyle modifications for her multiple health conditions.  ALLERGIES: Allergies  Allergen Reactions   Tape Other (See Comments)    Blisters and makes her skin peel     Ciprofloxacin Nausea Only    MEDICATIONS: Current Outpatient Medications on File Prior to Visit  Medication Sig Dispense Refill   atorvastatin (LIPITOR) 10 MG tablet Take 10 mg by mouth daily.     Calcium Carb-Cholecalciferol (CALCIUM + D3) 600-200 MG-UNIT TABS Take 1 tablet by mouth daily.     cholecalciferol (VITAMIN D3) 25 MCG (1000 UT) tablet Take 5,000 Units by mouth daily.      esomeprazole (NEXIUM) 20 MG capsule Take 20 mg by mouth daily at 12 noon.     Multiple Vitamin (MULTIVITAMIN WITH MINERALS) TABS tablet Take 1 tablet by mouth daily.     nebivolol (BYSTOLIC) 5 MG tablet Take 5 mg by mouth daily.     olmesartan (BENICAR) 20 MG tablet TAKE 1 (ONE) TABLET DAILY  5   No current facility-administered medications on file prior to visit.     PAST MEDICAL HISTORY: Past Medical History:  Diagnosis Date   Barrett esophagus    Cancer ([place])    basal cell on  scalp    Edema    GERD (gastroesophageal reflux disease)    takes nexium     Hyperlipidemia    Hypertension    Sleep apnea    Swelling     PAST SURGICAL HISTORY: Past Surgical History:  Procedure Laterality Date   ABDOMINAL HYSTERECTOMY     BASAL CELL CARCINOMA EXCISION  2016   CESAREAN SECTION     c/s times two    LAPAROSCOPIC VAGINAL HYSTERECTOMY WITH SALPINGECTOMY Bilateral 04/12/2015   Procedure: LAPAROSCOPIC ASSISTED VAGINAL HYSTERECTOMY WITH SALPINGECTOMY;  Surgeon: Eldred Manges, MD;  Location: [facility];  Service: Gynecology;  Laterality: Bilateral;    NASAL SEPTUM SURGERY      SOCIAL HISTORY: Social History   Tobacco Use   Smoking status: Never Smoker   Smokeless tobacco: Never Used  Substance Use Topics   Alcohol use: Yes    Comment: occasional    Drug use: No    FAMILY HISTORY: Family History  Problem Relation Age of Onset   Hypertension Mother    Hyperlipidemia Mother    Heart disease Mother    Thyroid disease Mother    Obesity Mother    Cancer Father    Stroke Father    Obesity Father    Cancer Sister    Hyperlipidemia Sister    Parkinsonism Maternal Grandmother    Parkinsonism Maternal Grandfather    Cancer Paternal Grandmother    Hyperlipidemia Sister    Hypertension Sister    Breast cancer Neg Hx    ROS: Review of Systems  Gastrointestinal: Negative for nausea and vomiting.  Musculoskeletal:       Negative for muscle weakness.   PHYSICAL EXAM: Blood pressure 117/74, pulse 68, temperature 98.5 F (36.9 C), temperature source Oral, height 5\' 10"  (1.778 m), weight 235 lb (106.6 kg), SpO2 97 %. Body mass index is 33.72 kg/m. Physical Exam Vitals signs reviewed.  Constitutional:      Appearance: Normal appearance. She is obese.  Cardiovascular:     Rate and Rhythm: Normal rate.     Pulses: Normal pulses.  Pulmonary:     Effort: Pulmonary effort is normal.     Breath sounds: Normal breath sounds.  Musculoskeletal: Normal range of motion.  Skin:    General: Skin is warm and dry.  Neurological:     Mental Status: She is alert and oriented to person, place, and time.  Psychiatric:        Behavior: Behavior normal.   RECENT LABS AND TESTS: BMET    Component Value Date/Time   NA 140 12/16/2018 1206   K 4.4 12/16/2018 1206   CL 100 12/16/2018 1206   CO2 25 12/16/2018 1206   GLUCOSE 89 12/16/2018 1206   GLUCOSE 98 04/12/2015 0835   BUN 18 12/16/2018 1206   CREATININE 0.86 12/16/2018 1206   CALCIUM 10.2 12/16/2018 1206   GFRNONAA 76 12/16/2018 1206   GFRAA 87 12/16/2018  1206   Lab Results  Component Value Date   HGBA1C 5.3 12/16/2018   HGBA1C 5.5 06/12/2018   Lab Results  Component Value Date   INSULIN 22.3 12/16/2018   INSULIN 19.6 06/12/2018   CBC    Component Value Date/Time   WBC 8.9 06/12/2018 1314   WBC 15.5 (H) 04/13/2015 0533   RBC 4.48 06/12/2018 1314   RBC 3.50 (L) 04/13/2015 0533   HGB 13.4 06/12/2018 1314   HCT 39.3 06/12/2018 1314   PLT 261 04/13/2015 0533   MCV 88 06/12/2018 1314  MCH 29.9 06/12/2018 1314   MCH 30.6 04/13/2015 0533   MCHC 34.1 06/12/2018 1314   MCHC 34.1 04/13/2015 0533   RDW 12.3 06/12/2018 1314   LYMPHSABS 2.7 06/12/2018 1314   EOSABS 0.3 06/12/2018 1314   BASOSABS 0.1 06/12/2018 1314   Iron/TIBC/Ferritin/ %Sat No results found for: IRON, TIBC, FERRITIN, IRONPCTSAT Lipid Panel     Component Value Date/Time   CHOL 170 12/16/2018 1206   TRIG 114 12/16/2018 1206   HDL 51 12/16/2018 1206   LDLCALC 96 12/16/2018 1206   Hepatic Function Panel     Component Value Date/Time   PROT 7.0 12/16/2018 1206   ALBUMIN 4.6 12/16/2018 1206   AST 17 12/16/2018 1206   ALT 20 12/16/2018 1206   ALKPHOS 97 12/16/2018 1206   BILITOT 0.3 12/16/2018 1206      Component Value Date/Time   TSH 3.950 06/12/2018 1314   TSH 1.806 04/13/2015 0623   Results for TELECIA, HINA (MRN [id number]) as of 02/26/2019 09:37  Ref. Range 12/16/2018 12:06  Vitamin D, 25-Hydroxy Latest Ref Range: 30.0 - 100.0 ng/mL 56.3   OBESITY BEHAVIORAL INTERVENTION VISIT  Today's visit was #14  Starting weight: 258 lbs Starting date: 06/12/2018 Today's weight: 235 lbs  Today's date: 02/25/2019 Total lbs lost to date: 23    02/25/2019  Height 5\' 10"  (1.778 m)  Weight 235 lb (106.6 kg)  BMI (Calculated) 33.72  BLOOD PRESSURE - SYSTOLIC [id number]  BLOOD PRESSURE - DIASTOLIC 74   Body Fat % 41 %  Total Body Water (lbs) 90.4 lbs   ASK: We discussed the diagnosis of obesity with [name] today and [name] agreed to give [name] permission  to discuss obesity behavioral modification therapy today.  ASSESS: [name] has the diagnosis of obesity and her BMI today is 33.8. [name] is in the action stage of change.   ADVISE: [name] was educated on the multiple health risks of obesity as well as the benefit of weight loss to improve her health. She was advised of the need for long term treatment and the importance of lifestyle modifications to improve her current health and to decrease her risk of future health problems.  AGREE: Multiple dietary modification options and treatment options were discussed and  [name] agreed to follow the recommendations documented in the above note.  ARRANGE: [name] was educated on the importance of frequent visits to treat obesity as outlined per CMS and USPSTF guidelines and agreed to schedule her next follow up appointment today.  [name], am acting as transcriptionist for Abby Potash, PA-C I, Abby Potash, PA-C have reviewed above note and agree with its content

## 2019-03-17 ENCOUNTER — Encounter (INDEPENDENT_AMBULATORY_CARE_PROVIDER_SITE_OTHER): Payer: Self-pay | Admitting: Physician Assistant

## 2019-03-17 ENCOUNTER — Other Ambulatory Visit: Payer: Self-pay

## 2019-03-17 ENCOUNTER — Ambulatory Visit (INDEPENDENT_AMBULATORY_CARE_PROVIDER_SITE_OTHER): Payer: Managed Care, Other (non HMO) | Admitting: Physician Assistant

## 2019-03-17 VITALS — BP 109/71 | HR 74 | Temp 98.5°F | Ht 70.0 in | Wt 235.0 lb

## 2019-03-17 DIAGNOSIS — E7849 Other hyperlipidemia: Secondary | ICD-10-CM

## 2019-03-17 DIAGNOSIS — E669 Obesity, unspecified: Secondary | ICD-10-CM | POA: Diagnosis not present

## 2019-03-17 DIAGNOSIS — Z6833 Body mass index (BMI) 33.0-33.9, adult: Secondary | ICD-10-CM

## 2019-03-18 NOTE — Progress Notes (Signed)
Office: [phone]  /  Fax: [phone]   HPI:   Chief Complaint: OBESITY [name] is here to discuss her progress with her obesity treatment plan. She is on the Category 3 plan and is following her eating plan approximately 80 % of the time. She states she is exercising 0 minutes 0 times per week. [name] reports that she has been unusually busy. She has been eating out more often at night and she is not journaling. Her weight is 235 lb (106.6 kg) today and she has maintained weight since her last visit. She has lost 23 lbs since starting treatment with [name].  Hyperlipidemia [name] has hyperlipidemia and she is on atorvastatin. She has been trying to improve her cholesterol levels with intensive lifestyle modification including a low saturated fat diet, exercise and weight loss. She denies any chest pain.  ASSESSMENT AND PLAN:  Other hyperlipidemia  Class 1 obesity with serious comorbidity and body mass index (BMI) of 33.0 to 33.9 in adult, unspecified obesity type  PLAN:  Hyperlipidemia [name] was informed of the American Heart Association Guidelines emphasizing intensive lifestyle modifications as the first line treatment for hyperlipidemia. We discussed many lifestyle modifications today in depth, and [name] will continue to work on decreasing saturated fats such as fatty red meat, butter and many fried foods. She will also increase vegetables and lean protein in her diet and continue to work on exercise and weight loss efforts. [name] will continue atorvastatin and follow up as directed.  I spent > than 50% of the 25 minute visit on counseling as documented in the note.  Obesity [name] is currently in the action stage of change. As such, her goal is to continue with weight loss efforts She has agreed to keep a food journal with 1500 calories and 95 grams of protein daily and follow the Category 3 plan [name] has been instructed to work up to a goal of 150 minutes of combined  cardio and strengthening exercise per week for weight loss and overall health benefits. We discussed the following Behavioral Modification Strategies today: keeping healthy foods in the home and work on meal planning and easy cooking plans  [name] has agreed to follow up with our clinic in 2 weeks. She was informed of the importance of frequent follow up visits to maximize her success with intensive lifestyle modifications for her multiple health conditions.  I spent > than 50% of the 25 minute visit on counseling as documented in the note.    ALLERGIES: Allergies  Allergen Reactions   Tape Other (See Comments)    Blisters and makes her skin peel     Ciprofloxacin Nausea Only    MEDICATIONS: Current Outpatient Medications on File Prior to Visit  Medication Sig Dispense Refill   atorvastatin (LIPITOR) 10 MG tablet Take 10 mg by mouth daily.     Calcium Carb-Cholecalciferol (CALCIUM + D3) 600-200 MG-UNIT TABS Take 1 tablet by mouth daily.     cholecalciferol (VITAMIN D3) 25 MCG (1000 UT) tablet Take 5,000 Units by mouth daily.      esomeprazole (NEXIUM) 20 MG capsule Take 20 mg by mouth daily at 12 noon.     Multiple Vitamin (MULTIVITAMIN WITH MINERALS) TABS tablet Take 1 tablet by mouth daily.     nebivolol (BYSTOLIC) 5 MG tablet Take 5 mg by mouth daily.     olmesartan (BENICAR) 20 MG tablet TAKE 1 (ONE) TABLET DAILY  5   No current facility-administered medications on file prior to visit.  PAST MEDICAL HISTORY: Past Medical History:  Diagnosis Date   Barrett esophagus    Cancer ([place])    basal cell on scalp    Edema    GERD (gastroesophageal reflux disease)    takes nexium     Hyperlipidemia    Hypertension    Sleep apnea    Swelling     PAST SURGICAL HISTORY: Past Surgical History:  Procedure Laterality Date   ABDOMINAL HYSTERECTOMY     BASAL CELL CARCINOMA EXCISION  2016   CESAREAN SECTION     c/s times two    LAPAROSCOPIC VAGINAL  HYSTERECTOMY WITH SALPINGECTOMY Bilateral 04/12/2015   Procedure: LAPAROSCOPIC ASSISTED VAGINAL HYSTERECTOMY WITH SALPINGECTOMY;  Surgeon: Eldred Manges, MD;  Location: [facility];  Service: Gynecology;  Laterality: Bilateral;   NASAL SEPTUM SURGERY      SOCIAL HISTORY: Social History   Tobacco Use   Smoking status: Never Smoker   Smokeless tobacco: Never Used  Substance Use Topics   Alcohol use: Yes    Comment: occasional    Drug use: No    FAMILY HISTORY: Family History  Problem Relation Age of Onset   Hypertension Mother    Hyperlipidemia Mother    Heart disease Mother    Thyroid disease Mother    Obesity Mother    Cancer Father    Stroke Father    Obesity Father    Cancer Sister    Hyperlipidemia Sister    Parkinsonism Maternal Grandmother    Parkinsonism Maternal Grandfather    Cancer Paternal Grandmother    Hyperlipidemia Sister    Hypertension Sister    Breast cancer Neg Hx     ROS: Review of Systems  Constitutional: Negative for weight loss.  Cardiovascular: Negative for chest pain.    PHYSICAL EXAM: Blood pressure 109/71, pulse 74, temperature 98.5 F (36.9 C), temperature source Oral, height 5\' 10"  (1.778 m), weight 235 lb (106.6 kg), SpO2 96 %. Body mass index is 33.72 kg/m. Physical Exam Vitals signs reviewed.  Constitutional:      Appearance: Normal appearance. She is well-developed. She is obese.  Cardiovascular:     Rate and Rhythm: Normal rate.  Pulmonary:     Effort: Pulmonary effort is normal.  Musculoskeletal: Normal range of motion.  Skin:    General: Skin is warm and dry.  Neurological:     Mental Status: She is alert and oriented to person, place, and time.  Psychiatric:        Mood and Affect: Mood normal.        Behavior: Behavior normal.     RECENT LABS AND TESTS: BMET    Component Value Date/Time   NA 140 12/16/2018 1206   K 4.4 12/16/2018 1206   CL 100 12/16/2018 1206   CO2 25 12/16/2018 1206    GLUCOSE 89 12/16/2018 1206   GLUCOSE 98 04/12/2015 0835   BUN 18 12/16/2018 1206   CREATININE 0.86 12/16/2018 1206   CALCIUM 10.2 12/16/2018 1206   GFRNONAA 76 12/16/2018 1206   GFRAA 87 12/16/2018 1206   Lab Results  Component Value Date   HGBA1C 5.3 12/16/2018   HGBA1C 5.5 06/12/2018   Lab Results  Component Value Date   INSULIN 22.3 12/16/2018   INSULIN 19.6 06/12/2018   CBC    Component Value Date/Time   WBC 8.9 06/12/2018 1314   WBC 15.5 (H) 04/13/2015 0533   RBC 4.48 06/12/2018 1314   RBC 3.50 (L) 04/13/2015 0533   HGB 13.4 06/12/2018  1314   HCT 39.3 06/12/2018 1314   PLT 261 04/13/2015 0533   MCV 88 06/12/2018 1314   MCH 29.9 06/12/2018 1314   MCH 30.6 04/13/2015 0533   MCHC 34.1 06/12/2018 1314   MCHC 34.1 04/13/2015 0533   RDW 12.3 06/12/2018 1314   LYMPHSABS 2.7 06/12/2018 1314   EOSABS 0.3 06/12/2018 1314   BASOSABS 0.1 06/12/2018 1314   Iron/TIBC/Ferritin/ %Sat No results found for: IRON, TIBC, FERRITIN, IRONPCTSAT Lipid Panel     Component Value Date/Time   CHOL 170 12/16/2018 1206   TRIG 114 12/16/2018 1206   HDL 51 12/16/2018 1206   LDLCALC 96 12/16/2018 1206   Hepatic Function Panel     Component Value Date/Time   PROT 7.0 12/16/2018 1206   ALBUMIN 4.6 12/16/2018 1206   AST 17 12/16/2018 1206   ALT 20 12/16/2018 1206   ALKPHOS 97 12/16/2018 1206   BILITOT 0.3 12/16/2018 1206      Component Value Date/Time   TSH 3.950 06/12/2018 1314   TSH 1.806 04/13/2015 0623     Ref. Range 12/16/2018 12:06  Vitamin D, 25-Hydroxy Latest Ref Range: 30.0 - 100.0 ng/mL 56.3    OBESITY BEHAVIORAL INTERVENTION VISIT  Today's visit was # 15   Starting weight: 258 lbs Starting date: 06/12/2018 Today's weight : 235 lbs  Today's date: 03/17/2019 Total lbs lost to date: 23    03/17/2019  Height 5\' 10"  (1.778 m)  Weight 235 lb (106.6 kg)  BMI (Calculated) 33.72  BLOOD PRESSURE - SYSTOLIC 0000000  BLOOD PRESSURE - DIASTOLIC 71   Body Fat % 99991111 %    Total Body Water (lbs) 87 lbs    ASK: We discussed the diagnosis of obesity with [name] today and Katharine Look agreed to give [name] permission to discuss obesity behavioral modification therapy today.  ASSESS: Daliya has the diagnosis of obesity and her BMI today is 33.72 Aissatou is in the action stage of change   ADVISE: Lionor was educated on the multiple health risks of obesity as well as the benefit of weight loss to improve her health. She was advised of the need for long term treatment and the importance of lifestyle modifications to improve her current health and to decrease her risk of future health problems.  AGREE: Multiple dietary modification options and treatment options were discussed and  Britne agreed to follow the recommendations documented in the above note.  ARRANGE: Brita was educated on the importance of frequent visits to treat obesity as outlined per CMS and USPSTF guidelines and agreed to schedule her next follow up appointment today.  Corey Skains, am acting as transcriptionist for Abby Potash, PA-C I, Abby Potash, PA-C have reviewed above note and agree with its content

## 2019-03-31 ENCOUNTER — Encounter: Payer: Self-pay | Admitting: Adult Health

## 2019-03-31 ENCOUNTER — Ambulatory Visit: Payer: Managed Care, Other (non HMO) | Admitting: Adult Health

## 2019-03-31 VITALS — BP 118/71 | HR 73 | Ht 70.0 in | Wt 238.4 lb

## 2019-03-31 DIAGNOSIS — G4733 Obstructive sleep apnea (adult) (pediatric): Secondary | ICD-10-CM

## 2019-03-31 DIAGNOSIS — Z9989 Dependence on other enabling machines and devices: Secondary | ICD-10-CM

## 2019-03-31 NOTE — Patient Instructions (Signed)
Continue using CPAP nightly and greater than 4 hours each night °If your symptoms worsen or you develop new symptoms please let us know.  ° °

## 2019-03-31 NOTE — Progress Notes (Signed)
PATIENT: [name] DOB: 1961-04-05  REASON FOR VISIT: follow up HISTORY FROM: patient  HISTORY OF PRESENT ILLNESS: Today 03/31/19:  [name] is a 58 year old female with a history of obstructive sleep apnea on CPAP.  Her download indicates that she use her machine nightly for compliance of 100%.  She used her machine greater than 4 hours each night.  On average she uses her machine 7 hours and 40 minutes.  Her residual AHI is 2 on 5 to 15 cm of water with EPR of 3.  Her leak in the 95th percentile is 8.4.  She reports that the CPAP continues to work well for her.  She denies any new issues.  She returns today for evaluation.   REVIEW OF SYSTEMS: Out of a complete 14 system review of symptoms, the patient complains only of the following symptoms, and all other reviewed systems are negative.  ESS 1 FSS22  ALLERGIES: Allergies  Allergen Reactions  . Tape Other (See Comments)    Blisters and makes her skin peel    . Ciprofloxacin Nausea Only    HOME MEDICATIONS: Outpatient Medications Prior to Visit  Medication Sig Dispense Refill  . atorvastatin (LIPITOR) 10 MG tablet Take 10 mg by mouth daily.    . Calcium Carb-Cholecalciferol (CALCIUM + D3) 600-200 MG-UNIT TABS Take 1 tablet by mouth daily.    . cholecalciferol (VITAMIN D3) 25 MCG (1000 UT) tablet Take 5,000 Units by mouth daily.     Marland Kitchen esomeprazole (NEXIUM) 20 MG capsule Take 20 mg by mouth daily at 12 noon.    . Multiple Vitamin (MULTIVITAMIN WITH MINERALS) TABS tablet Take 1 tablet by mouth daily.    . nebivolol (BYSTOLIC) 5 MG tablet Take 5 mg by mouth daily.    Marland Kitchen olmesartan (BENICAR) 20 MG tablet TAKE 1 (ONE) TABLET DAILY  5   No facility-administered medications prior to visit.     PAST MEDICAL HISTORY: Past Medical History:  Diagnosis Date  . Barrett esophagus   . Cancer ([place])    basal cell on scalp   . Edema   . GERD (gastroesophageal reflux disease)    takes nexium    . Hyperlipidemia   .  Hypertension   . Sleep apnea   . Swelling     PAST SURGICAL HISTORY: Past Surgical History:  Procedure Laterality Date  . ABDOMINAL HYSTERECTOMY    . BASAL CELL CARCINOMA EXCISION  2016  . CESAREAN SECTION     c/s times two   . LAPAROSCOPIC VAGINAL HYSTERECTOMY WITH SALPINGECTOMY Bilateral 04/12/2015   Procedure: LAPAROSCOPIC ASSISTED VAGINAL HYSTERECTOMY WITH SALPINGECTOMY;  Surgeon: Eldred Manges, MD;  Location: [facility];  Service: Gynecology;  Laterality: Bilateral;  . NASAL SEPTUM SURGERY      FAMILY HISTORY: Family History  Problem Relation Age of Onset  . Hypertension Mother   . Hyperlipidemia Mother   . Heart disease Mother   . Thyroid disease Mother   . Obesity Mother   . Cancer Father   . Stroke Father   . Obesity Father   . Cancer Sister   . Hyperlipidemia Sister   . Parkinsonism Maternal Grandmother   . Parkinsonism Maternal Grandfather   . Cancer Paternal Grandmother   . Hyperlipidemia Sister   . Hypertension Sister   . Breast cancer Neg Hx     SOCIAL HISTORY: Social History   Socioeconomic History  . Marital status: Married    Spouse name: Elsie Lout  . Number of children:  2  . Years of education: Not on file  . Highest education level: Not on file  Occupational History  . Occupation: Insurance underwriter  Social Needs  . Financial resource strain: Not on file  . Food insecurity    Worry: Not on file    Inability: Not on file  . Transportation needs    Medical: Not on file    Non-medical: Not on file  Tobacco Use  . Smoking status: Never Smoker  . Smokeless tobacco: Never Used  Substance and Sexual Activity  . Alcohol use: Yes    Comment: occasional   . Drug use: No  . Sexual activity: Yes    Birth control/protection: None    Comment: Ablation  Lifestyle  . Physical activity    Days per week: Not on file    Minutes per session: Not on file  . Stress: Not on file  Relationships  . Social Herbalist on phone: Not on file     Gets together: Not on file    Attends religious service: Not on file    Active member of club or organization: Not on file    Attends meetings of clubs or organizations: Not on file    Relationship status: Not on file  . Intimate partner violence    Fear of current or ex partner: Not on file    Emotionally abused: Not on file    Physically abused: Not on file    Forced sexual activity: Not on file  Other Topics Concern  . Not on file  Social History Narrative  . Not on file      PHYSICAL EXAM  Vitals:   03/31/19 0830  BP: 118/71  Pulse: 73  Weight: 238 lb 6.4 oz (108.1 kg)  Height: 5\' 10"  (1.778 m)   Body mass index is 34.21 kg/m.  Generalized: Well developed, in no acute distress  Chest: Lungs clear to auscultation bilaterally  Neurological examination  Mentation: Alert oriented to time, place, history taking. Follows all commands speech and language fluent Cranial nerve II-XII: Extraocular movements were full, visual field were full on confrontational test Head turning and shoulder shrug  were normal and symmetric. Motor: The motor testing reveals 5 over 5 strength of all 4 extremities. Good symmetric motor tone is noted throughout.  Sensory: Sensory testing is intact to soft touch on all 4 extremities. No evidence of extinction is noted.  Gait and station: Gait is normal.    DIAGNOSTIC DATA (LABS, IMAGING, TESTING) - I reviewed patient records, labs, notes, testing and imaging myself where available.  Lab Results  Component Value Date   WBC 8.9 06/12/2018   HGB 13.4 06/12/2018   HCT 39.3 06/12/2018   MCV 88 06/12/2018   PLT 261 04/13/2015      Component Value Date/Time   NA 140 12/16/2018 1206   K 4.4 12/16/2018 1206   CL 100 12/16/2018 1206   CO2 25 12/16/2018 1206   GLUCOSE 89 12/16/2018 1206   GLUCOSE 98 04/12/2015 0835   BUN 18 12/16/2018 1206   CREATININE 0.86 12/16/2018 1206   CALCIUM 10.2 12/16/2018 1206   PROT 7.0 12/16/2018 1206   ALBUMIN  4.6 12/16/2018 1206   AST 17 12/16/2018 1206   ALT 20 12/16/2018 1206   ALKPHOS 97 12/16/2018 1206   BILITOT 0.3 12/16/2018 1206   GFRNONAA 76 12/16/2018 1206   GFRAA 87 12/16/2018 1206   Lab Results  Component Value Date   CHOL 170  12/16/2018   HDL 51 12/16/2018   LDLCALC 96 12/16/2018   TRIG 114 12/16/2018   Lab Results  Component Value Date   HGBA1C 5.3 12/16/2018   Lab Results  Component Value Date   [id number] 06/12/2018   Lab Results  Component Value Date   TSH 3.950 06/12/2018      ASSESSMENT AND PLAN 58 y.o. year old female  has a past medical history of Barrett esophagus, Cancer ([place]), Edema, GERD (gastroesophageal reflux disease), Hyperlipidemia, Hypertension, Sleep apnea, and Swelling. here with:  1. Obstructive sleep apnea on CPAP  Patient CPAP download shows excellent compliance and good treatment of her apnea.  She is encouraged to continue using CPAP nightly and greater than 4 hours each night.  She is advised that if her symptoms worsen or she develops new symptoms she should let us know.  She will follow-up in 1 year or sooner if needed    I spent 15 minutes with the patient. 50% of this time was spent reviewing CPAP download  Ward Givens, MSN, NP-C 03/31/2019, 9:42 AM [facility] [address], [place], [place] [address] [phone]

## 2019-04-02 ENCOUNTER — Ambulatory Visit (INDEPENDENT_AMBULATORY_CARE_PROVIDER_SITE_OTHER): Payer: Managed Care, Other (non HMO) | Admitting: Physician Assistant

## 2019-04-02 ENCOUNTER — Other Ambulatory Visit: Payer: Self-pay

## 2019-04-02 ENCOUNTER — Encounter (INDEPENDENT_AMBULATORY_CARE_PROVIDER_SITE_OTHER): Payer: Self-pay | Admitting: Physician Assistant

## 2019-04-02 VITALS — BP 109/65 | HR 78 | Temp 98.9°F | Ht 70.0 in | Wt 237.0 lb

## 2019-04-02 DIAGNOSIS — Z6834 Body mass index (BMI) 34.0-34.9, adult: Secondary | ICD-10-CM

## 2019-04-02 DIAGNOSIS — E559 Vitamin D deficiency, unspecified: Secondary | ICD-10-CM | POA: Diagnosis not present

## 2019-04-02 DIAGNOSIS — E669 Obesity, unspecified: Secondary | ICD-10-CM | POA: Diagnosis not present

## 2019-04-03 NOTE — Progress Notes (Signed)
Office: [phone]  /  Fax: [phone]   HPI:   Chief Complaint: OBESITY Lanina is here to discuss her progress with her obesity treatment plan. She is on the keep a food journal with 1500 calories and 95 grams of protein daily or follow the Category 3 plan and is following her eating plan approximately 85 % of the time. She states she is exercising 0 minutes 0 times per week. Yarethzi reports frustration with lack of weight loss as she has been following the plan closely. She is now journaling.  Her weight is 237 lb (107.5 kg) today and has gained 2 lbs since her last visit. She has lost 21 lbs since starting treatment with [name].  Vitamin D Deficiency Dyasia has a diagnosis of vitamin D deficiency. She is currently taking OTC Vit D3 and denies nausea, vomiting or muscle weakness.  ASSESSMENT AND PLAN:  Vitamin D deficiency  Class 1 obesity with serious comorbidity and body mass index (BMI) of 34.0 to 34.9 in adult, unspecified obesity type  PLAN:  Vitamin D Deficiency Raeah was informed that low vitamin D levels contributes to fatigue and are associated with obesity, breast, and colon cancer. Annasofia agrees to continue taking OTC Vit D3 5,000 IU daily and will follow up for routine testing of vitamin D, at least 2-3 times per year. She was informed of the risk of over-replacement of vitamin D and agrees to not increase her dose unless she discusses this with [name] first. Sibley agrees to follow up with our clinic in 3 weeks.  I spent > than 50% of the 25 minute visit on counseling as documented in the note.  Obesity Calee is currently in the action stage of change. As such, her goal is to continue with weight loss efforts She has agreed to keep a food journal with 1500 calories and 95 grams of protein daily Wynema has been instructed to work up to a goal of 150 minutes of combined cardio and strengthening exercise per week for weight loss and overall health benefits. We discussed  the following Behavioral Modification Strategies today: work on meal planning and easy cooking plans, keeping healthy foods in the home, and holiday eating strategies    Ryilee has agreed to follow up with our clinic in 3 weeks. She was informed of the importance of frequent follow up visits to maximize her success with intensive lifestyle modifications for her multiple health conditions.  I spent > than 50% of the 25 minute visit on counseling as documented in the note.   ALLERGIES: Allergies  Allergen Reactions  . Tape Other (See Comments)    Blisters and makes her skin peel    . Ciprofloxacin Nausea Only    MEDICATIONS: Current Outpatient Medications on File Prior to Visit  Medication Sig Dispense Refill  . atorvastatin (LIPITOR) 10 MG tablet Take 10 mg by mouth daily.    . Calcium Carb-Cholecalciferol (CALCIUM + D3) 600-200 MG-UNIT TABS Take 1 tablet by mouth daily.    . cholecalciferol (VITAMIN D3) 25 MCG (1000 UT) tablet Take 5,000 Units by mouth daily.     Marland Kitchen esomeprazole (NEXIUM) 20 MG capsule Take 20 mg by mouth daily at 12 noon.    . Multiple Vitamin (MULTIVITAMIN WITH MINERALS) TABS tablet Take 1 tablet by mouth daily.    . nebivolol (BYSTOLIC) 5 MG tablet Take 5 mg by mouth daily.    Marland Kitchen olmesartan (BENICAR) 20 MG tablet TAKE 1 (ONE) TABLET DAILY  5   No  current facility-administered medications on file prior to visit.     PAST MEDICAL HISTORY: Past Medical History:  Diagnosis Date  . Barrett esophagus   . Cancer ([place])    basal cell on scalp   . Edema   . GERD (gastroesophageal reflux disease)    takes nexium    . Hyperlipidemia   . Hypertension   . Sleep apnea   . Swelling     PAST SURGICAL HISTORY: Past Surgical History:  Procedure Laterality Date  . ABDOMINAL HYSTERECTOMY    . BASAL CELL CARCINOMA EXCISION  2016  . CESAREAN SECTION     c/s times two   . LAPAROSCOPIC VAGINAL HYSTERECTOMY WITH SALPINGECTOMY Bilateral 04/12/2015   Procedure: LAPAROSCOPIC  ASSISTED VAGINAL HYSTERECTOMY WITH SALPINGECTOMY;  Surgeon: Eldred Manges, MD;  Location: [facility];  Service: Gynecology;  Laterality: Bilateral;  . NASAL SEPTUM SURGERY      SOCIAL HISTORY: Social History   Tobacco Use  . Smoking status: Never Smoker  . Smokeless tobacco: Never Used  Substance Use Topics  . Alcohol use: Yes    Comment: occasional   . Drug use: No    FAMILY HISTORY: Family History  Problem Relation Age of Onset  . Hypertension Mother   . Hyperlipidemia Mother   . Heart disease Mother   . Thyroid disease Mother   . Obesity Mother   . Cancer Father   . Stroke Father   . Obesity Father   . Cancer Sister   . Hyperlipidemia Sister   . Parkinsonism Maternal Grandmother   . Parkinsonism Maternal Grandfather   . Cancer Paternal Grandmother   . Hyperlipidemia Sister   . Hypertension Sister   . Breast cancer Neg Hx     ROS: Review of Systems  Constitutional: Negative for weight loss.  Gastrointestinal: Negative for nausea and vomiting.  Musculoskeletal:       Negative muscle weakness    PHYSICAL EXAM: Blood pressure 109/65, pulse 78, temperature 98.9 F (37.2 C), temperature source Oral, height 5\' 10"  (1.778 m), weight 237 lb (107.5 kg), SpO2 97 %. Body mass index is 34.01 kg/m. Physical Exam Vitals signs reviewed.  Constitutional:      Appearance: Normal appearance. She is obese.  Cardiovascular:     Rate and Rhythm: Normal rate.     Pulses: Normal pulses.  Pulmonary:     Effort: Pulmonary effort is normal.     Breath sounds: Normal breath sounds.  Musculoskeletal: Normal range of motion.  Skin:    General: Skin is warm and dry.  Neurological:     Mental Status: She is alert and oriented to person, place, and time.  Psychiatric:        Mood and Affect: Mood normal.        Behavior: Behavior normal.     RECENT LABS AND TESTS: BMET    Component Value Date/Time   NA 140 12/16/2018 1206   K 4.4 12/16/2018 1206   CL 100 12/16/2018 1206    CO2 25 12/16/2018 1206   GLUCOSE 89 12/16/2018 1206   GLUCOSE 98 04/12/2015 0835   BUN 18 12/16/2018 1206   CREATININE 0.86 12/16/2018 1206   CALCIUM 10.2 12/16/2018 1206   GFRNONAA 76 12/16/2018 1206   GFRAA 87 12/16/2018 1206   Lab Results  Component Value Date   HGBA1C 5.3 12/16/2018   HGBA1C 5.5 06/12/2018   Lab Results  Component Value Date   INSULIN 22.3 12/16/2018   INSULIN 19.6 06/12/2018   CBC  Component Value Date/Time   WBC 8.9 06/12/2018 1314   WBC 15.5 (H) 04/13/2015 0533   RBC 4.48 06/12/2018 1314   RBC 3.50 (L) 04/13/2015 0533   HGB 13.4 06/12/2018 1314   HCT 39.3 06/12/2018 1314   PLT 261 04/13/2015 0533   MCV 88 06/12/2018 1314   MCH 29.9 06/12/2018 1314   MCH 30.6 04/13/2015 0533   MCHC 34.1 06/12/2018 1314   MCHC 34.1 04/13/2015 0533   RDW 12.3 06/12/2018 1314   LYMPHSABS 2.7 06/12/2018 1314   EOSABS 0.3 06/12/2018 1314   BASOSABS 0.1 06/12/2018 1314   Iron/TIBC/Ferritin/ %Sat No results found for: IRON, TIBC, FERRITIN, IRONPCTSAT Lipid Panel     Component Value Date/Time   CHOL 170 12/16/2018 1206   TRIG 114 12/16/2018 1206   HDL 51 12/16/2018 1206   LDLCALC 96 12/16/2018 1206   Hepatic Function Panel     Component Value Date/Time   PROT 7.0 12/16/2018 1206   ALBUMIN 4.6 12/16/2018 1206   AST 17 12/16/2018 1206   ALT 20 12/16/2018 1206   ALKPHOS 97 12/16/2018 1206   BILITOT 0.3 12/16/2018 1206      Component Value Date/Time   TSH 3.950 06/12/2018 1314   TSH 1.806 04/13/2015 0623      OBESITY BEHAVIORAL INTERVENTION VISIT  Today's visit was # 16   Starting weight: 258 lbs Starting date: 06/12/2018 Today's weight : 237 lbs Today's date: 04/02/2019 Total lbs lost to date: 21    ASK: We discussed the diagnosis of obesity with [name] today and Katharine Look agreed to give [name] permission to discuss obesity behavioral modification therapy today.  ASSESS: Peris has the diagnosis of obesity and her BMI today is 34.01  Maleea is in the action stage of change   ADVISE: Walta was educated on the multiple health risks of obesity as well as the benefit of weight loss to improve her health. She was advised of the need for long term treatment and the importance of lifestyle modifications to improve her current health and to decrease her risk of future health problems.  AGREE: Multiple dietary modification options and treatment options were discussed and  Patte agreed to follow the recommendations documented in the above note.  ARRANGE: Oluwatamilore was educated on the importance of frequent visits to treat obesity as outlined per CMS and USPSTF guidelines and agreed to schedule her next follow up appointment today.  Wilhemena Durie, am acting as transcriptionist for Abby Potash, PA-C I, Abby Potash, PA-C have reviewed above note and agree with its content

## 2019-04-23 ENCOUNTER — Ambulatory Visit (INDEPENDENT_AMBULATORY_CARE_PROVIDER_SITE_OTHER): Payer: Managed Care, Other (non HMO) | Admitting: Physician Assistant

## 2019-04-23 ENCOUNTER — Encounter (INDEPENDENT_AMBULATORY_CARE_PROVIDER_SITE_OTHER): Payer: Self-pay | Admitting: Physician Assistant

## 2019-04-23 ENCOUNTER — Other Ambulatory Visit: Payer: Self-pay

## 2019-04-23 VITALS — BP 117/72 | HR 68 | Temp 98.1°F | Ht 70.0 in | Wt 235.0 lb

## 2019-04-23 DIAGNOSIS — I10 Essential (primary) hypertension: Secondary | ICD-10-CM | POA: Diagnosis not present

## 2019-04-23 DIAGNOSIS — Z6833 Body mass index (BMI) 33.0-33.9, adult: Secondary | ICD-10-CM | POA: Diagnosis not present

## 2019-04-23 DIAGNOSIS — E669 Obesity, unspecified: Secondary | ICD-10-CM

## 2019-04-24 NOTE — Progress Notes (Signed)
Office: [phone]  /  Fax: [phone]   HPI:   Chief Complaint: OBESITY [name] is here to discuss her progress with her obesity treatment plan. She is on the Category 3 plan and is following her eating plan approximately 80 % of the time. She states she is exercising 0 minutes 0 times per week. [name] states that she is happy with the loss of 2 pounds, given the recent holiday and family events. She is motivated to continue. Her weight is 235 lb (106.6 kg) today and has had a weight loss of 2 pounds over a period of 3 weeks since her last visit. She has lost 23 lbs since starting treatment with [name].  Hypertension [name] is a 58 y.o. female with hypertension. She is on Forensic psychologist. [name] denies chest pain. She recently saw her cardiologist, who states that she can cut her Benicar in half. She is working weight loss to help control her blood pressure with the goal of decreasing her risk of heart attack and stroke. [name] blood pressure is currently controlled.  ASSESSMENT AND PLAN:  Essential hypertension  Class 1 obesity with serious comorbidity and body mass index (BMI) of 33.0 to 33.9 in adult, unspecified obesity type  PLAN:  Hypertension We discussed sodium restriction, working on healthy weight loss, and a regular exercise program as the means to achieve improved blood pressure control. [name] agreed with this plan and agreed to follow up as directed. We will continue to monitor her blood pressure as well as her progress with the above lifestyle modifications. She will continue her medications as prescribed and will watch for signs of hypotension as she continues her lifestyle modifications.  I spent > than 50% of the 25 minute visit on counseling as documented in the note.  Obesity [name] is currently in the action stage of change. As such, her goal is to continue with weight loss efforts She has agreed to follow the Category 3 plan [name] has  been instructed to work up to a goal of 150 minutes of combined cardio and strengthening exercise per week for weight loss and overall health benefits. We discussed the following Behavioral Modification Strategies today: keeping healthy foods in the home and work on meal planning and easy cooking plans  [name] has agreed to follow up with our clinic in 3 weeks. She was informed of the importance of frequent follow up visits to maximize her success with intensive lifestyle modifications for her multiple health conditions.  I spent > than 50% of the 25 minute visit on counseling as documented in the note.    ALLERGIES: Allergies  Allergen Reactions   Tape Other (See Comments)    Blisters and makes her skin peel     Ciprofloxacin Nausea Only    MEDICATIONS: Current Outpatient Medications on File Prior to Visit  Medication Sig Dispense Refill   atorvastatin (LIPITOR) 10 MG tablet Take 10 mg by mouth daily.     Calcium Carb-Cholecalciferol (CALCIUM + D3) 600-200 MG-UNIT TABS Take 1 tablet by mouth daily.     cholecalciferol (VITAMIN D3) 25 MCG (1000 UT) tablet Take 5,000 Units by mouth daily.      esomeprazole (NEXIUM) 20 MG capsule Take 20 mg by mouth daily at 12 noon.     Multiple Vitamin (MULTIVITAMIN WITH MINERALS) TABS tablet Take 1 tablet by mouth daily.     nebivolol (BYSTOLIC) 5 MG tablet Take 5 mg by mouth daily.     olmesartan (  BENICAR) 20 MG tablet TAKE 1 (ONE) TABLET DAILY  5   No current facility-administered medications on file prior to visit.     PAST MEDICAL HISTORY: Past Medical History:  Diagnosis Date   Barrett esophagus    Cancer ([place])    basal cell on scalp    Edema    GERD (gastroesophageal reflux disease)    takes nexium     Hyperlipidemia    Hypertension    Sleep apnea    Swelling     PAST SURGICAL HISTORY: Past Surgical History:  Procedure Laterality Date   ABDOMINAL HYSTERECTOMY     BASAL CELL CARCINOMA EXCISION  2016    CESAREAN SECTION     c/s times two    LAPAROSCOPIC VAGINAL HYSTERECTOMY WITH SALPINGECTOMY Bilateral 04/12/2015   Procedure: LAPAROSCOPIC ASSISTED VAGINAL HYSTERECTOMY WITH SALPINGECTOMY;  Surgeon: Eldred Manges, MD;  Location: [facility];  Service: Gynecology;  Laterality: Bilateral;   NASAL SEPTUM SURGERY      SOCIAL HISTORY: Social History   Tobacco Use   Smoking status: Never Smoker   Smokeless tobacco: Never Used  Substance Use Topics   Alcohol use: Yes    Comment: occasional    Drug use: No    FAMILY HISTORY: Family History  Problem Relation Age of Onset   Hypertension Mother    Hyperlipidemia Mother    Heart disease Mother    Thyroid disease Mother    Obesity Mother    Cancer Father    Stroke Father    Obesity Father    Cancer Sister    Hyperlipidemia Sister    Parkinsonism Maternal Grandmother    Parkinsonism Maternal Grandfather    Cancer Paternal Grandmother    Hyperlipidemia Sister    Hypertension Sister    Breast cancer Neg Hx     ROS: Review of Systems  Constitutional: Positive for weight loss.  Cardiovascular: Negative for chest pain.    PHYSICAL EXAM: Blood pressure 117/72, pulse 68, temperature 98.1 F (36.7 C), temperature source Oral, height 5\' 10"  (1.778 m), weight 235 lb (106.6 kg), SpO2 97 %. Body mass index is 33.72 kg/m. Physical Exam Vitals signs reviewed.  Constitutional:      Appearance: Normal appearance. She is well-developed. She is obese.  Cardiovascular:     Rate and Rhythm: Normal rate.  Pulmonary:     Effort: Pulmonary effort is normal.  Musculoskeletal: Normal range of motion.  Skin:    General: Skin is warm and dry.  Neurological:     Mental Status: She is alert and oriented to person, place, and time.  Psychiatric:        Mood and Affect: Mood normal.        Behavior: Behavior normal.     RECENT LABS AND TESTS: BMET    Component Value Date/Time   NA 140 12/16/2018 1206   K 4.4  12/16/2018 1206   CL 100 12/16/2018 1206   CO2 25 12/16/2018 1206   GLUCOSE 89 12/16/2018 1206   GLUCOSE 98 04/12/2015 0835   BUN 18 12/16/2018 1206   CREATININE 0.86 12/16/2018 1206   CALCIUM 10.2 12/16/2018 1206   GFRNONAA 76 12/16/2018 1206   GFRAA 87 12/16/2018 1206   Lab Results  Component Value Date   HGBA1C 5.3 12/16/2018   HGBA1C 5.5 06/12/2018   Lab Results  Component Value Date   INSULIN 22.3 12/16/2018   INSULIN 19.6 06/12/2018   CBC    Component Value Date/Time   WBC 8.9 06/12/2018  1314   WBC 15.5 (H) 04/13/2015 0533   RBC 4.48 06/12/2018 1314   RBC 3.50 (L) 04/13/2015 0533   HGB 13.4 06/12/2018 1314   HCT 39.3 06/12/2018 1314   PLT 261 04/13/2015 0533   MCV 88 06/12/2018 1314   MCH 29.9 06/12/2018 1314   MCH 30.6 04/13/2015 0533   MCHC 34.1 06/12/2018 1314   MCHC 34.1 04/13/2015 0533   RDW 12.3 06/12/2018 1314   LYMPHSABS 2.7 06/12/2018 1314   EOSABS 0.3 06/12/2018 1314   BASOSABS 0.1 06/12/2018 1314   Iron/TIBC/Ferritin/ %Sat No results found for: IRON, TIBC, FERRITIN, IRONPCTSAT Lipid Panel     Component Value Date/Time   CHOL 170 12/16/2018 1206   TRIG 114 12/16/2018 1206   HDL 51 12/16/2018 1206   LDLCALC 96 12/16/2018 1206   Hepatic Function Panel     Component Value Date/Time   PROT 7.0 12/16/2018 1206   ALBUMIN 4.6 12/16/2018 1206   AST 17 12/16/2018 1206   ALT 20 12/16/2018 1206   ALKPHOS 97 12/16/2018 1206   BILITOT 0.3 12/16/2018 1206      Component Value Date/Time   TSH 3.950 06/12/2018 1314   TSH 1.806 04/13/2015 0623     Ref. Range 12/16/2018 12:06  Vitamin D, 25-Hydroxy Latest Ref Range: 30.0 - 100.0 ng/mL 56.3    OBESITY BEHAVIORAL INTERVENTION VISIT  Today's visit was # 17   Starting weight: 258 lbs Starting date: 06/12/2018 Today's weight : 235 lbs Today's date: 04/23/2019 Total lbs lost to date: 23    04/23/2019  Height 5\' 10"  (1.778 m)  Weight 235 lb (106.6 kg)  BMI (Calculated) 33.72  BLOOD PRESSURE -  SYSTOLIC [id number]  BLOOD PRESSURE - DIASTOLIC 72   Body Fat % [id number] %  Total Body Water (lbs) 89.2 lbs    ASK: We discussed the diagnosis of obesity with [name] today and [name] agreed to give [name] permission to discuss obesity behavioral modification therapy today.  ASSESS: [name] has the diagnosis of obesity and her BMI today is 33.72 [name] is in the action stage of change   ADVISE: [name] was educated on the multiple health risks of obesity as well as the benefit of weight loss to improve her health. She was advised of the need for long term treatment and the importance of lifestyle modifications to improve her current health and to decrease her risk of future health problems.  AGREE: Multiple dietary modification options and treatment options were discussed and  [name] agreed to follow the recommendations documented in the above note.  ARRANGE: [name] was educated on the importance of frequent visits to treat obesity as outlined per CMS and USPSTF guidelines and agreed to schedule her next follow up appointment today.  Corey Skains, am acting as transcriptionist for Abby Potash, PA-C I, Abby Potash, PA-C have reviewed above note and agree with its content

## 2019-05-14 ENCOUNTER — Ambulatory Visit (INDEPENDENT_AMBULATORY_CARE_PROVIDER_SITE_OTHER): Payer: Managed Care, Other (non HMO) | Admitting: Physician Assistant

## 2019-06-02 ENCOUNTER — Ambulatory Visit (INDEPENDENT_AMBULATORY_CARE_PROVIDER_SITE_OTHER): Payer: Managed Care, Other (non HMO) | Admitting: Physician Assistant

## 2019-06-02 ENCOUNTER — Encounter (INDEPENDENT_AMBULATORY_CARE_PROVIDER_SITE_OTHER): Payer: Self-pay | Admitting: Physician Assistant

## 2019-06-02 ENCOUNTER — Other Ambulatory Visit: Payer: Self-pay

## 2019-06-02 VITALS — BP 114/70 | HR 79 | Temp 98.3°F | Ht 70.0 in | Wt 235.0 lb

## 2019-06-02 DIAGNOSIS — E669 Obesity, unspecified: Secondary | ICD-10-CM

## 2019-06-02 DIAGNOSIS — E7849 Other hyperlipidemia: Secondary | ICD-10-CM

## 2019-06-02 DIAGNOSIS — Z6833 Body mass index (BMI) 33.0-33.9, adult: Secondary | ICD-10-CM | POA: Diagnosis not present

## 2019-06-04 NOTE — Progress Notes (Signed)
Chief Complaint:   OBESITY [name] is here to discuss her progress with her obesity treatment plan along with follow-up of her obesity related diagnoses. [name] is on the Category 3 Plan and states she is following her eating plan approximately 60% of the time. Sylvan states she is walking and riding the recumbent bike 30 minutes 5 times per week.  Today's visit was #: 18 Starting weight: 258 lbs Starting date: 06/12/2018 Today's weight: 235 lbs Today's date: 06/02/2019 Total lbs lost to date: 23 Total lbs lost since last in-office visit: 0  Interim History: [name] reports that she gained 4 pounds over the holidays and since then has lost them. She is back on track. She wants to start exercising more regularly.  Subjective:   Other hyperlipidemia [name] is on Atorvastatin. She has no chest pain or myalgias. Her last lipid panel was normal. [name] is due for labs.  Assessment/Plan:   Other hyperlipidemia Cardiovascular risk and specific lipid/LDL goals reviewed.  We discussed several lifestyle modifications today and [name] will continue medications and to work on diet, exercise and weight loss efforts. Orders and follow up as documented in patient record.   Counseling Intensive lifestyle modifications are the first line treatment for this issue. . Dietary changes: Increase soluble fiber. Decrease simple carbohydrates. . Exercise changes: Moderate to vigorous-intensity aerobic activity 150 minutes per week if tolerated. . Lipid-lowering medications: see documented in medical record.  Obesity [name] is currently in the action stage of change. As such, her goal is to continue with weight loss efforts. She has agreed to on the Category 3 Plan.   We discussed the following exercise goals today: For substantial health benefits, adults should do at least 150 minutes (2 hours and 30 minutes) a week of moderate-intensity, or 75 minutes (1 hour and 15 minutes) a week of  vigorous-intensity aerobic physical activity, or an equivalent combination of moderate- and vigorous-intensity aerobic activity. Aerobic activity should be performed in episodes of at least 10 minutes, and preferably, it should be spread throughout the week. Adults should also include muscle-strengthening activities that involve all major muscle groups on 2 or more days a week.  We discussed the following behavioral modification strategies today: meal planning and cooking strategies and keeping healthy foods in the home.  [name] has agreed to follow-up with our clinic in 3 weeks. She was informed of the importance of frequent follow-up visits to maximize her success with intensive lifestyle modifications for her multiple health conditions.   Objective:   Blood pressure 114/70, pulse 79, temperature 98.3 F (36.8 C), temperature source Oral, height 5\' 10"  (1.778 m), weight 235 lb (106.6 kg), SpO2 96 %. Body mass index is 33.72 kg/m.  General: Cooperative, alert, well developed, in no acute distress. HEENT: Conjunctivae and lids unremarkable. Neck: No thyromegaly.  Cardiovascular: Regular rhythm.  Lungs: Normal work of breathing. Extremities: No edema.  Neurologic: No focal deficits.   Lab Results  Component Value Date   CREATININE 0.86 12/16/2018   BUN 18 12/16/2018   NA 140 12/16/2018   K 4.4 12/16/2018   CL 100 12/16/2018   CO2 25 12/16/2018   Lab Results  Component Value Date   ALT 20 12/16/2018   AST 17 12/16/2018   ALKPHOS 97 12/16/2018   BILITOT 0.3 12/16/2018   Lab Results  Component Value Date   HGBA1C 5.3 12/16/2018   HGBA1C 5.5 06/12/2018   Lab Results  Component Value Date   INSULIN  22.3 12/16/2018   INSULIN 19.6 06/12/2018   Lab Results  Component Value Date   TSH 3.950 06/12/2018   Lab Results  Component Value Date   CHOL 170 12/16/2018   HDL 51 12/16/2018   LDLCALC 96 12/16/2018   TRIG 114 12/16/2018   Lab Results  Component Value Date    WBC 8.9 06/12/2018   HGB 13.4 06/12/2018   HCT 39.3 06/12/2018   MCV 88 06/12/2018   PLT 261 04/13/2015   No results found for: IRON, TIBC, FERRITIN   Ref. Range 12/16/2018 12:06  Vitamin D, 25-Hydroxy Latest Ref Range: 30.0 - 100.0 ng/mL 56.3    Attestation Statements:   Reviewed by clinician on day of visit: allergies, medications, problem list, medical history, surgical history, family history, social history, and previous encounter notes.  Time spent on visit including pre-visit chart review and post-visit care was 25 minutes.   Corey Skains, am acting as Location manager for [employer], PA-C.  I have reviewed the above documentation for accuracy and completeness, and I agree with the above. Abby Potash, PA-C

## 2019-06-26 ENCOUNTER — Ambulatory Visit (INDEPENDENT_AMBULATORY_CARE_PROVIDER_SITE_OTHER): Payer: Managed Care, Other (non HMO) | Admitting: Physician Assistant

## 2019-06-26 ENCOUNTER — Encounter (INDEPENDENT_AMBULATORY_CARE_PROVIDER_SITE_OTHER): Payer: Self-pay | Admitting: Physician Assistant

## 2019-06-26 ENCOUNTER — Other Ambulatory Visit: Payer: Self-pay

## 2019-06-26 VITALS — BP 114/74 | HR 81 | Temp 97.7°F | Ht 70.0 in | Wt 235.0 lb

## 2019-06-26 DIAGNOSIS — E7849 Other hyperlipidemia: Secondary | ICD-10-CM

## 2019-06-26 DIAGNOSIS — E559 Vitamin D deficiency, unspecified: Secondary | ICD-10-CM

## 2019-06-26 DIAGNOSIS — E669 Obesity, unspecified: Secondary | ICD-10-CM

## 2019-06-26 DIAGNOSIS — Z6833 Body mass index (BMI) 33.0-33.9, adult: Secondary | ICD-10-CM | POA: Diagnosis not present

## 2019-06-26 NOTE — Progress Notes (Signed)
Chief Complaint:   OBESITY [name] is here to discuss her progress with her obesity treatment plan along with follow-up of her obesity related diagnoses. [name] is on the Category 3 Plan and states she is following her eating plan approximately 75% of the time. [name] states she is walking and bike riding for 30 minutes 3 times per week.  Today's visit was #: 97 Starting weight: 258 lbs Starting date: 06/12/2018 Today's weight: 235 lbs Today's date: 06/26/2019 Total lbs lost to date: 23 Total lbs lost since last in-office visit: 0  Interim History: [name] reports that she does well with breakfast and lunch, but she falls off during dinner. She states that she may not be eating enough snack calories.  Subjective:   1. Other hyperlipidemia [name] is on atorvastatin and she denies chest pain or myalgias. Last level was normal.  2. Vitamin D deficiency [name] is on OTC Vit D, and she denies nausea, vomiting, or muscle weakness.  Assessment/Plan:   1. Other hyperlipidemia Cardiovascular risk and specific lipid/LDL goals reviewed. We discussed several lifestyle modifications today. [name] agreed to continue her medications, and will continue to work on diet, exercise and weight loss efforts. Orders and follow up as documented in patient record.   Counseling Intensive lifestyle modifications are the first line treatment for this issue. . Dietary changes: Increase soluble fiber. Decrease simple carbohydrates. . Exercise changes: Moderate to vigorous-intensity aerobic activity 150 minutes per week if tolerated. . Lipid-lowering medications: see documented in medical record.  2. Vitamin D deficiency Low Vitamin D level contributes to fatigue and are associated with obesity, breast, and colon cancer. [name] agreed to continue taking OTC Vit D and will follow-up for routine testing of Vitamin D, at least 2-3 times per year to avoid over-replacement.  3. Class 1 obesity with serious  comorbidity and body mass index (BMI) of 33.0 to 33.9 in adult, unspecified obesity type [name] is currently in the action stage of change. As such, her goal is to continue with weight loss efforts. She has agreed to the Category 3 Plan and keeping a food journal and adhering to recommended goals of 450-600 calories and 40 grams of protein at supper daily.   Exercise goals: No exercise has been prescribed at this time.  Behavioral modification strategies: meal planning and cooking strategies and planning for success.  [name] has agreed to follow-up with our clinic in 2 weeks. She was informed of the importance of frequent follow-up visits to maximize her success with intensive lifestyle modifications for her multiple health conditions.   Objective:   Blood pressure 114/74, pulse 81, temperature 97.7 F (36.5 C), temperature source Oral, height 5\' 10"  (1.778 m), weight 235 lb (106.6 kg), SpO2 94 %. Body mass index is 33.72 kg/m.  General: Cooperative, alert, well developed, in no acute distress. HEENT: Conjunctivae and lids unremarkable. Cardiovascular: Regular rhythm.  Lungs: Normal work of breathing. Neurologic: No focal deficits.   Lab Results  Component Value Date   CREATININE 0.86 12/16/2018   BUN 18 12/16/2018   NA 140 12/16/2018   K 4.4 12/16/2018   CL 100 12/16/2018   CO2 25 12/16/2018   Lab Results  Component Value Date   ALT 20 12/16/2018   AST 17 12/16/2018   ALKPHOS 97 12/16/2018   BILITOT 0.3 12/16/2018   Lab Results  Component Value Date   HGBA1C 5.3 12/16/2018   HGBA1C 5.5 06/12/2018   Lab Results  Component Value Date   INSULIN 22.3  12/16/2018   INSULIN 19.6 06/12/2018   Lab Results  Component Value Date   TSH 3.950 06/12/2018   Lab Results  Component Value Date   CHOL 170 12/16/2018   HDL 51 12/16/2018   LDLCALC 96 12/16/2018   TRIG 114 12/16/2018   Lab Results  Component Value Date   WBC 8.9 06/12/2018   HGB 13.4 06/12/2018   HCT 39.3  06/12/2018   MCV 88 06/12/2018   PLT 261 04/13/2015   No results found for: IRON, TIBC, FERRITIN  Attestation Statements:   Reviewed by clinician on day of visit: allergies, medications, problem list, medical history, surgical history, family history, social history, and previous encounter notes.  Time spent on visit including pre-visit chart review and post-visit care was 32 minutes.    Wilhemena Durie, am acting as transcriptionist for [employer], PA-C.  I have reviewed the above documentation for accuracy and completeness, and I agree with the above. Abby Potash, PA-C

## 2019-07-09 ENCOUNTER — Encounter (INDEPENDENT_AMBULATORY_CARE_PROVIDER_SITE_OTHER): Payer: Self-pay

## 2019-07-10 ENCOUNTER — Ambulatory Visit (INDEPENDENT_AMBULATORY_CARE_PROVIDER_SITE_OTHER): Payer: Managed Care, Other (non HMO) | Admitting: Physician Assistant

## 2019-08-02 ENCOUNTER — Ambulatory Visit: Payer: Self-pay

## 2019-08-18 ENCOUNTER — Other Ambulatory Visit: Payer: Self-pay

## 2019-08-18 ENCOUNTER — Encounter (INDEPENDENT_AMBULATORY_CARE_PROVIDER_SITE_OTHER): Payer: Self-pay | Admitting: Physician Assistant

## 2019-08-18 ENCOUNTER — Ambulatory Visit (INDEPENDENT_AMBULATORY_CARE_PROVIDER_SITE_OTHER): Payer: Managed Care, Other (non HMO) | Admitting: Physician Assistant

## 2019-08-18 ENCOUNTER — Other Ambulatory Visit: Payer: Self-pay | Admitting: Internal Medicine

## 2019-08-18 VITALS — BP 113/72 | HR 73 | Temp 98.4°F | Ht 70.0 in | Wt 237.0 lb

## 2019-08-18 DIAGNOSIS — Z6834 Body mass index (BMI) 34.0-34.9, adult: Secondary | ICD-10-CM | POA: Diagnosis not present

## 2019-08-18 DIAGNOSIS — E669 Obesity, unspecified: Secondary | ICD-10-CM

## 2019-08-18 DIAGNOSIS — E8881 Metabolic syndrome: Secondary | ICD-10-CM | POA: Diagnosis not present

## 2019-08-18 DIAGNOSIS — Z1231 Encounter for screening mammogram for malignant neoplasm of breast: Secondary | ICD-10-CM

## 2019-08-18 NOTE — Progress Notes (Signed)
Chief Complaint:   OBESITY [name] is here to discuss her progress with her obesity treatment plan along with follow-up of her obesity related diagnoses. [name] is on the Category 3 Plan and states she is following her eating plan approximately 75% of the time. [name] states she is exercising 0 minutes 0 times per week.  Today's visit was #: 20 Starting weight: 258 lbs Starting date: 06/12/2018 Today's weight: 237 lbs Today's date: 08/18/2019 Total lbs lost to date: 21 Total lbs lost since last in-office visit: 0  Interim History: [name] feels that she gained some weight and then lost it again over the last month and a half. She is going on a month long vacation to [place].  Subjective:   Insulin resistance. [name] has a diagnosis of insulin resistance based on her elevated fasting insulin level >5. She continues to work on diet and exercise to decrease her risk of diabetes. [name] is on no medications. No polyphagia. She is due for labs.  Lab Results  Component Value Date   INSULIN 22.3 12/16/2018   INSULIN 19.6 06/12/2018   Lab Results  Component Value Date   HGBA1C 5.3 12/16/2018   Assessment/Plan:   Insulin resistance. [name] will continue to work on weight loss, exercise, and decreasing simple carbohydrates to help decrease the risk of diabetes. [name] agreed to follow-up with [name] as directed to closely monitor her progress.  Class 1 obesity with serious comorbidity and body mass index (BMI) of 34.0 to 34.9 in adult, unspecified obesity type.  [name] is currently in the action stage of change. As such, her goal is to continue with weight loss efforts. She has agreed to keeping a food journal and adhering to recommended goals of 1400-1500 calories and 95 grams of protein daily.  Exercise goals: For substantial health benefits, adults should do at least 150 minutes (2 hours and 30 minutes) a week of moderate-intensity, or 75 minutes (1 hour and 15 minutes) a  week of vigorous-intensity aerobic physical activity, or an equivalent combination of moderate- and vigorous-intensity aerobic activity. Aerobic activity should be performed in episodes of at least 10 minutes, and preferably, it should be spread throughout the week.  Behavioral modification strategies: meal planning and cooking strategies, travel eating strategies and planning for success.  [name] has agreed to follow-up with our clinic in 5 weeks. She was informed of the importance of frequent follow-up visits to maximize her success with intensive lifestyle modifications for her multiple health conditions.   Objective:   Blood pressure 113/72, pulse 73, temperature 98.4 F (36.9 C), temperature source Oral, height 5\' 10"  (1.778 m), weight 237 lb (107.5 kg), SpO2 98 %. Body mass index is 34.01 kg/m.  General: Cooperative, alert, well developed, in no acute distress. HEENT: Conjunctivae and lids unremarkable. Cardiovascular: Regular rhythm.  Lungs: Normal work of breathing. Neurologic: No focal deficits.   Lab Results  Component Value Date   CREATININE 0.86 12/16/2018   BUN 18 12/16/2018   NA 140 12/16/2018   K 4.4 12/16/2018   CL 100 12/16/2018   CO2 25 12/16/2018   Lab Results  Component Value Date   ALT 20 12/16/2018   AST 17 12/16/2018   ALKPHOS 97 12/16/2018   BILITOT 0.3 12/16/2018   Lab Results  Component Value Date   HGBA1C 5.3 12/16/2018   HGBA1C 5.5 06/12/2018   Lab Results  Component Value Date   INSULIN 22.3 12/16/2018   INSULIN 19.6 06/12/2018   Lab  Results  Component Value Date   TSH 3.950 06/12/2018   Lab Results  Component Value Date   CHOL 170 12/16/2018   HDL 51 12/16/2018   LDLCALC 96 12/16/2018   TRIG 114 12/16/2018   Lab Results  Component Value Date   WBC 8.9 06/12/2018   HGB 13.4 06/12/2018   HCT 39.3 06/12/2018   MCV 88 06/12/2018   PLT 261 04/13/2015   No results found for: IRON, TIBC, FERRITIN  Attestation Statements:    Reviewed by clinician on day of visit: allergies, medications, problem list, medical history, surgical history, family history, social history, and previous encounter notes.  Time spent on visit including pre-visit chart review and post-visit charting and care was 30 minutes.   IMichaelene Song, am acting as transcriptionist for Abby Potash, PA-C   I have reviewed the above documentation for accuracy and completeness, and I agree with the above. Abby Potash, PA-C

## 2019-09-25 ENCOUNTER — Ambulatory Visit: Payer: Self-pay

## 2019-10-06 ENCOUNTER — Ambulatory Visit
Admission: RE | Admit: 2019-10-06 | Discharge: 2019-10-06 | Disposition: A | Discharge status: Home / Self Care | Payer: No Typology Code available for payment source | Source: Ambulatory Visit | Attending: Internal Medicine | Admitting: Internal Medicine

## 2019-10-06 ENCOUNTER — Other Ambulatory Visit: Payer: Self-pay

## 2019-10-06 DIAGNOSIS — Z1231 Encounter for screening mammogram for malignant neoplasm of breast: Secondary | ICD-10-CM

## 2020-02-04 ENCOUNTER — Ambulatory Visit: Payer: Managed Care, Other (non HMO) | Admitting: Cardiology

## 2020-03-29 NOTE — Progress Notes (Signed)
PATIENT: [name] DOB: [birth year]  REASON FOR VISIT: follow up HISTORY FROM: patient  HISTORY OF PRESENT ILLNESS: Today 03/29/20:  [name] is a 59 year old female with a history of obstructive sleep apnea on CPAP.  Her download indicates that she used her machine nightly for compliance of 100%.  She used her machine greater than 4 hours each night.  On average she uses her machine 7 hours and 37 minutes.  Her residual AHI is 2 on 5 to 15 cm of water with EPR 3.  Leak in the 95th percentile is 6.  She reports that the CPAP is working well for her.  She denies any new issues.  She returns today for an evaluation.  HISTORY 03/31/19:  [name] is a 59 year old female with a history of obstructive sleep apnea on CPAP.  Her download indicates that she use her machine nightly for compliance of 100%.  She used her machine greater than 4 hours each night.  On average she uses her machine 7 hours and 40 minutes.  Her residual AHI is 2 on 5 to 15 cm of water with EPR of 3.  Her leak in the 95th percentile is 8.4.  She reports that the CPAP continues to work well for her.  She denies any new issues.  She returns today for evaluation.  REVIEW OF SYSTEMS: Out of a complete 14 system review of symptoms, the patient complains only of the following symptoms, and all other reviewed systems are negative.  FSS 9 ESS 2  ALLERGIES: Allergies  Allergen Reactions  . Tape Other (See Comments)    Blisters and makes her skin peel    . Ciprofloxacin Nausea Only    HOME MEDICATIONS: Outpatient Medications Prior to Visit  Medication Sig Dispense Refill  . atorvastatin (LIPITOR) 10 MG tablet Take 10 mg by mouth daily.    . Calcium Carb-Cholecalciferol (CALCIUM + D3) 600-200 MG-UNIT TABS Take 1 tablet by mouth daily.    . cholecalciferol (VITAMIN D3) 25 MCG (1000 UT) tablet Take 5,000 Units by mouth daily.     Marland Kitchen esomeprazole (NEXIUM) 20 MG capsule Take 20 mg by mouth daily at 12 noon.    .  Multiple Vitamin (MULTIVITAMIN WITH MINERALS) TABS tablet Take 1 tablet by mouth daily.    . nebivolol (BYSTOLIC) 5 MG tablet Take 5 mg by mouth daily.    Marland Kitchen olmesartan (BENICAR) 20 MG tablet TAKE 1 (ONE) TABLET DAILY  5   No facility-administered medications prior to visit.    PAST MEDICAL HISTORY: Past Medical History:  Diagnosis Date  . Barrett esophagus   . Cancer ([place])    basal cell on scalp   . Edema   . GERD (gastroesophageal reflux disease)    takes nexium    . Hyperlipidemia   . Hypertension   . Sleep apnea   . Swelling     PAST SURGICAL HISTORY: Past Surgical History:  Procedure Laterality Date  . ABDOMINAL HYSTERECTOMY    . BASAL CELL CARCINOMA EXCISION  2016  . CESAREAN SECTION     c/s times two   . LAPAROSCOPIC VAGINAL HYSTERECTOMY WITH SALPINGECTOMY Bilateral 04/12/2015   Procedure: LAPAROSCOPIC ASSISTED VAGINAL HYSTERECTOMY WITH SALPINGECTOMY;  Surgeon: Eldred Manges, MD;  Location: [facility];  Service: Gynecology;  Laterality: Bilateral;  . NASAL SEPTUM SURGERY      FAMILY HISTORY: Family History  Problem Relation Age of Onset  . Hypertension Mother   . Hyperlipidemia Mother   .  Heart disease Mother   . Thyroid disease Mother   . Obesity Mother   . Cancer Father   . Stroke Father   . Obesity Father   . Cancer Sister   . Hyperlipidemia Sister   . Parkinsonism Maternal Grandmother   . Parkinsonism Maternal Grandfather   . Cancer Paternal Grandmother   . Hyperlipidemia Sister   . Hypertension Sister   . Breast cancer Neg Hx     SOCIAL HISTORY: Social History   Socioeconomic History  . Marital status: Married    Spouse name: Jayleah Garbers  . Number of children: 2  . Years of education: Not on file  . Highest education level: Not on file  Occupational History  . Occupation: Insurance underwriter  Tobacco Use  . Smoking status: Never Smoker  . Smokeless tobacco: Never Used  Vaping Use  . Vaping Use: Never used  Substance and Sexual Activity    . Alcohol use: Yes    Comment: occasional   . Drug use: No  . Sexual activity: Yes    Birth control/protection: None    Comment: Ablation  Other Topics Concern  . Not on file  Social History Narrative  . Not on file   Social Determinants of Health   Financial Resource Strain:   . Difficulty of Paying Living Expenses: Not on file  Food Insecurity:   . Worried About Charity fundraiser in the Last Year: Not on file  . Ran Out of Food in the Last Year: Not on file  Transportation Needs:   . Lack of Transportation (Medical): Not on file  . Lack of Transportation (Non-Medical): Not on file  Physical Activity:   . Days of Exercise per Week: Not on file  . Minutes of Exercise per Session: Not on file  Stress:   . Feeling of Stress : Not on file  Social Connections:   . Frequency of Communication with Friends and Family: Not on file  . Frequency of Social Gatherings with Friends and Family: Not on file  . Attends Religious Services: Not on file  . Active Member of Clubs or Organizations: Not on file  . Attends Archivist Meetings: Not on file  . Marital Status: Not on file  Intimate Partner Violence:   . Fear of Current or Ex-Partner: Not on file  . Emotionally Abused: Not on file  . Physically Abused: Not on file  . Sexually Abused: Not on file      PHYSICAL EXAM  Vitals:   03/30/20 0843  BP: 124/80  Pulse: 75  Weight: 251 lb (113.9 kg)  Height: 5\' 10"  (1.778 m)   Body mass index is 36.01 kg/m.  Generalized: Well developed, in no acute distress  Chest: Lungs clear to auscultation bilaterally  Neurological examination  Mentation: Alert oriented to time, place, history taking. Follows all commands speech and language fluent Cranial nerve II-XII: Extraocular movements were full, visual field were full on confrontational test Head turning and shoulder shrug  were normal and symmetric. Motor: The motor testing reveals 5 over 5 strength of all 4 extremities.  Good symmetric motor tone is noted throughout.  Sensory: Sensory testing is intact to soft touch on all 4 extremities. No evidence of extinction is noted.  Gait and station: Gait is normal.    DIAGNOSTIC DATA (LABS, IMAGING, TESTING) - I reviewed patient records, labs, notes, testing and imaging myself where available.  Lab Results  Component Value Date   WBC 8.9 06/12/2018  HGB 13.4 06/12/2018   HCT 39.3 06/12/2018   MCV 88 06/12/2018   PLT 261 04/13/2015      Component Value Date/Time   NA 140 12/16/2018 1206   K 4.4 12/16/2018 1206   CL 100 12/16/2018 1206   CO2 25 12/16/2018 1206   GLUCOSE 89 12/16/2018 1206   GLUCOSE 98 04/12/2015 0835   BUN 18 12/16/2018 1206   CREATININE 0.86 12/16/2018 1206   CALCIUM 10.2 12/16/2018 1206   PROT 7.0 12/16/2018 1206   ALBUMIN 4.6 12/16/2018 1206   AST 17 12/16/2018 1206   ALT 20 12/16/2018 1206   ALKPHOS 97 12/16/2018 1206   BILITOT 0.3 12/16/2018 1206   GFRNONAA 76 12/16/2018 1206   GFRAA 87 12/16/2018 1206   Lab Results  Component Value Date   CHOL 170 12/16/2018   HDL 51 12/16/2018   LDLCALC 96 12/16/2018   TRIG 114 12/16/2018   Lab Results  Component Value Date   HGBA1C 5.3 12/16/2018   Lab Results  Component Value Date   VITAMINB12 761 06/12/2018   Lab Results  Component Value Date   TSH 3.950 06/12/2018      ASSESSMENT AND PLAN 59 y.o. year old female  has a past medical history of Barrett esophagus, Cancer ([place]), Edema, GERD (gastroesophageal reflux disease), Hyperlipidemia, Hypertension, Sleep apnea, and Swelling. here with:  1. OSA on CPAP  - CPAP compliance excellent - Good treatment of AHI  - Encourage patient to use CPAP nightly and > 4 hours each night - F/U in 1 year or sooner if needed   I spent 20 minutes of face-to-face and non-face-to-face time with patient.  This included previsit chart review, lab review, study review, order entry, electronic health record documentation, patient  education.  Ward Givens, MSN, NP-C 03/29/2020, 3:26 PM [facility] [address] [place], [place] [address] [phone]

## 2020-03-30 ENCOUNTER — Ambulatory Visit: Payer: No Typology Code available for payment source | Admitting: Adult Health

## 2020-03-30 ENCOUNTER — Encounter: Payer: Self-pay | Admitting: Adult Health

## 2020-03-30 ENCOUNTER — Other Ambulatory Visit: Payer: Self-pay

## 2020-03-30 VITALS — BP 124/80 | HR 75 | Ht 70.0 in | Wt 251.0 lb

## 2020-03-30 DIAGNOSIS — G4733 Obstructive sleep apnea (adult) (pediatric): Secondary | ICD-10-CM | POA: Diagnosis not present

## 2020-03-30 DIAGNOSIS — Z9989 Dependence on other enabling machines and devices: Secondary | ICD-10-CM | POA: Diagnosis not present

## 2020-03-30 NOTE — Patient Instructions (Signed)
Continue using CPAP nightly and greater than 4 hours each night °If your symptoms worsen or you develop new symptoms please let us know.  ° °

## 2020-09-09 ENCOUNTER — Other Ambulatory Visit: Payer: Self-pay | Admitting: Internal Medicine

## 2020-09-09 DIAGNOSIS — Z1231 Encounter for screening mammogram for malignant neoplasm of breast: Secondary | ICD-10-CM

## 2020-11-02 ENCOUNTER — Ambulatory Visit: Payer: No Typology Code available for payment source

## 2020-12-02 ENCOUNTER — Ambulatory Visit
Admission: RE | Admit: 2020-12-02 | Discharge: 2020-12-02 | Disposition: A | Discharge status: Home / Self Care | Payer: No Typology Code available for payment source | Source: Ambulatory Visit | Attending: Internal Medicine | Admitting: Internal Medicine

## 2020-12-02 ENCOUNTER — Other Ambulatory Visit: Payer: Self-pay

## 2020-12-02 DIAGNOSIS — Z1231 Encounter for screening mammogram for malignant neoplasm of breast: Secondary | ICD-10-CM

## 2021-03-21 ENCOUNTER — Ambulatory Visit (INDEPENDENT_AMBULATORY_CARE_PROVIDER_SITE_OTHER): Payer: No Typology Code available for payment source | Admitting: Obstetrics & Gynecology

## 2021-03-21 ENCOUNTER — Other Ambulatory Visit: Payer: Self-pay

## 2021-03-21 ENCOUNTER — Encounter (HOSPITAL_BASED_OUTPATIENT_CLINIC_OR_DEPARTMENT_OTHER): Payer: Self-pay | Admitting: Obstetrics & Gynecology

## 2021-03-21 VITALS — BP 133/81 | HR 72 | Ht 68.0 in | Wt 249.8 lb

## 2021-03-21 DIAGNOSIS — Z01419 Encounter for gynecological examination (general) (routine) without abnormal findings: Secondary | ICD-10-CM | POA: Diagnosis not present

## 2021-03-21 DIAGNOSIS — Z780 Asymptomatic menopausal state: Secondary | ICD-10-CM | POA: Diagnosis not present

## 2021-03-21 DIAGNOSIS — Z6837 Body mass index (BMI) 37.0-37.9, adult: Secondary | ICD-10-CM | POA: Diagnosis not present

## 2021-03-21 DIAGNOSIS — R220 Localized swelling, mass and lump, head: Secondary | ICD-10-CM

## 2021-03-21 DIAGNOSIS — Z90710 Acquired absence of both cervix and uterus: Secondary | ICD-10-CM

## 2021-03-21 DIAGNOSIS — Z124 Encounter for screening for malignant neoplasm of cervix: Secondary | ICD-10-CM

## 2021-03-21 DIAGNOSIS — L819 Disorder of pigmentation, unspecified: Secondary | ICD-10-CM | POA: Insufficient documentation

## 2021-03-21 DIAGNOSIS — R609 Edema, unspecified: Secondary | ICD-10-CM | POA: Diagnosis not present

## 2021-03-21 NOTE — Progress Notes (Signed)
60 y.o. G2P2 Married White or Caucasian female here for annual exam/new patient exam.  Does has hot flashes but this is improved.  Reports "menopause has not been good to me".  Reports the major thing she fights is her weight.  Feels she gained 30 pounds with menopause.  Feels more "puffy".  She has tried a diuretic.  Feels face has change as well.    Did go to [facility].  She lost weight but gained it back.    Dr. Joylene Draft is her PCP.  Screening lab work done there.    No LMP recorded. Patient has had a hysterectomy.          Sexually active: Yes.    The current method of family planning is status post hysterectomy.    Exercising: tried to walk 30 minutes twice weekly Smoker:  no  Health Maintenance: Pap:  possibly 2019 History of abnormal Pap:  no MMG:  12/02/20 neg Colonoscopy:  2020, Dr. Cristina Gong BMD:  Dr. Joylene Draft Screening Labs: does with Dr. Joylene Draft   reports that she has never smoked. She has never used smokeless tobacco. She reports current alcohol use. She reports that she does not use drugs.  Past Medical History:  Diagnosis Date   Barrett esophagus    Cancer ([place])    basal cell on scalp    Edema    GERD (gastroesophageal reflux disease)    takes nexium     Hyperlipidemia    Hypertension    Sleep apnea    on CPAP   Swelling     Past Surgical History:  Procedure Laterality Date   BASAL CELL CARCINOMA EXCISION  2016   CESAREAN SECTION     c/s times two    CESAREAN SECTION     x 2   LAPAROSCOPIC VAGINAL HYSTERECTOMY WITH SALPINGECTOMY Bilateral 04/12/2015   Procedure: LAPAROSCOPIC ASSISTED VAGINAL HYSTERECTOMY WITH SALPINGECTOMY;  Surgeon: Eldred Manges, MD;  Location: [facility];  Service: Gynecology;  Laterality: Bilateral;   NASAL SEPTUM SURGERY      Current Outpatient Medications  Medication Sig Dispense Refill   atorvastatin (LIPITOR) 10 MG tablet Take 10 mg by mouth daily.     cholecalciferol (VITAMIN D3) 25 MCG (1000 UT) tablet Take 5,000  Units by mouth daily.      esomeprazole (NEXIUM) 20 MG capsule Take 20 mg by mouth daily at 12 noon.     levothyroxine (SYNTHROID) 25 MCG tablet Take 25 mcg by mouth every morning.     Multiple Vitamin (MULTIVITAMIN WITH MINERALS) TABS tablet Take 1 tablet by mouth daily.     nebivolol (BYSTOLIC) 5 MG tablet Take 5 mg by mouth daily.     olmesartan (BENICAR) 20 MG tablet TAKE 1 (ONE) TABLET DAILY  5   Calcium Carb-Cholecalciferol (CALCIUM + D3) 600-200 MG-UNIT TABS Take 1 tablet by mouth daily. (Patient not taking: Reported on 03/21/2021)     No current facility-administered medications for this visit.    Family History  Problem Relation Age of Onset   Hypertension Mother    Hyperlipidemia Mother    Heart disease Mother    Thyroid disease Mother    Obesity Mother    Cancer Father    Stroke Father    Obesity Father    Cancer Sister    Hyperlipidemia Sister    Parkinsonism Maternal Grandmother    Parkinsonism Maternal Grandfather    Cancer Paternal Grandmother    Hyperlipidemia Sister    Hypertension Sister  Breast cancer Neg Hx     Review of Systems  All other systems reviewed and are negative.  Exam:   BP 133/81   Pulse 72   Ht 5\' 8"  (1.727 m)   Wt 249 lb 12.8 oz (113.3 kg)   BMI 37.98 kg/m   Height: 5\' 8"  (172.7 cm)  General appearance: alert, cooperative and appears stated age Head: Normocephalic, without obvious abnormality, atraumatic Neck: no adenopathy, supple, symmetrical, trachea midline and thyroid normal to inspection and palpation Lungs: clear to auscultation bilaterally Breasts: normal appearance, no masses or tenderness Heart: regular rate and rhythm Abdomen: soft, non-tender; bowel sounds normal; no masses,  no organomegaly Extremities: extremities normal, atraumatic, no cyanosis or edema Skin: Skin color, texture, turgor normal. No rashes or lesions Lymph nodes: Cervical, supraclavicular, and axillary nodes normal. No abnormal inguinal nodes  palpated Neurologic: Grossly normal  Pelvic: External genitalia:  no lesions              Urethra:  normal appearing urethra with no masses, tenderness or lesions              Bartholins and Skenes: normal                 Vagina: normal appearing vagina with normal color and no discharge, no lesions              Cervix: absent              Pap taken: No. Bimanual Exam:  Uterus:  uterus absent              Adnexa: no mass, fullness, tenderness               Rectovaginal: Confirms               Anus:  normal sphincter tone, no lesions  Chaperone, Ezekiel Ina, RN, was present for exam.  Assessment/Plan:  1. Well woman exam with routine gynecological exam - pap smear not indicated.  Normal pap smears prior to hysterectomy and normal cervical pathology (reviewed) - MMG 11/2020 - colonoscopy 2020.  Release signed today - BMD done with Dr. Joylene Draft - Care gaps reviewed/updated - Dr. Joylene Draft does screening blood work  2. H/O: hysterectomy - LAVH/bilateral salpingectomy with Dr. Leo Grosser 03/2015  3. Postmenopausal - no HRT  4. Facial swelling - d/w pt doing 24 hour urine for total cortisol.  Information given.  5. Edema, unspecified type  6. BMI 37.0-37.9, adult

## 2021-03-24 ENCOUNTER — Other Ambulatory Visit (HOSPITAL_BASED_OUTPATIENT_CLINIC_OR_DEPARTMENT_OTHER): Payer: Self-pay | Admitting: Obstetrics & Gynecology

## 2021-03-24 DIAGNOSIS — R635 Abnormal weight gain: Secondary | ICD-10-CM

## 2021-03-24 DIAGNOSIS — R220 Localized swelling, mass and lump, head: Secondary | ICD-10-CM

## 2021-03-29 ENCOUNTER — Encounter (HOSPITAL_BASED_OUTPATIENT_CLINIC_OR_DEPARTMENT_OTHER): Payer: Self-pay | Admitting: *Deleted

## 2021-03-30 ENCOUNTER — Telehealth (HOSPITAL_BASED_OUTPATIENT_CLINIC_OR_DEPARTMENT_OTHER): Payer: Self-pay | Admitting: Obstetrics & Gynecology

## 2021-03-30 NOTE — Telephone Encounter (Signed)
LMOVM that results were not back yet. I spoke with [facility] who states that results are in process and turn around time is 4-6 business days.

## 2021-03-30 NOTE — Telephone Encounter (Signed)
Patient called and left a message wanting a update on her urine results from last week that she drop off.

## 2021-04-04 LAB — CORTISOL, URINE, FREE
Cortisol (Ur), Free: 14 ug/24 hr (ref 6–42)
Cortisol,F,ug/L,U: 7 ug/L

## 2021-04-04 NOTE — Progress Notes (Signed)
PATIENT: [name] DOB: 12-30-1960  REASON FOR VISIT: follow up HISTORY FROM: patient PRIMARY NEUROLOGIST:   Virtual Visit via Video Note  I connected with [name] on 04/05/21 at  1:15 PM EST by a video enabled telemedicine application located remotely at [facility] and verified that I am speaking with the correct person using two identifiers who was located at their own home.   I discussed the limitations of evaluation and management by telemedicine and the availability of in person appointments. The patient expressed understanding and agreed to proceed.   PATIENT: [name] DOB: 12-23-1960  REASON FOR VISIT: follow up HISTORY FROM: patient  HISTORY OF PRESENT ILLNESS: Today 04/05/21:  [name] is a 60 year old female with a history of obstructive sleep apnea on CPAP.  She returns today for follow-up.  She reports that the CPAP is working well for her.  She denies any new issues.  She returns today for an evaluation.    REVIEW OF SYSTEMS: Out of a complete 14 system review of symptoms, the patient complains only of the following symptoms, and all other reviewed systems are negative.   ALLERGIES: Allergies  Allergen Reactions   Tape Other (See Comments)    Blisters and makes her skin peel     Ciprofloxacin Nausea Only    HOME MEDICATIONS: Outpatient Medications Prior to Visit  Medication Sig Dispense Refill   atorvastatin (LIPITOR) 10 MG tablet Take 10 mg by mouth daily.     Calcium Carb-Cholecalciferol (CALCIUM + D3) 600-200 MG-UNIT TABS Take 1 tablet by mouth daily. (Patient not taking: Reported on 03/21/2021)     cholecalciferol (VITAMIN D3) 25 MCG (1000 UT) tablet Take 5,000 Units by mouth daily.      esomeprazole (NEXIUM) 20 MG capsule Take 20 mg by mouth daily at 12 noon.     levothyroxine (SYNTHROID) 25 MCG tablet Take 25 mcg by mouth every morning.     Multiple Vitamin (MULTIVITAMIN WITH MINERALS) TABS tablet  Take 1 tablet by mouth daily.     nebivolol (BYSTOLIC) 5 MG tablet Take 5 mg by mouth daily.     olmesartan (BENICAR) 20 MG tablet TAKE 1 (ONE) TABLET DAILY  5   No facility-administered medications prior to visit.    PAST MEDICAL HISTORY: Past Medical History:  Diagnosis Date   Barrett esophagus    Cancer ([place])    basal cell on scalp    Edema    GERD (gastroesophageal reflux disease)    takes nexium     Hyperlipidemia    Hypertension    Sleep apnea    on CPAP   Swelling     PAST SURGICAL HISTORY: Past Surgical History:  Procedure Laterality Date   BASAL CELL CARCINOMA EXCISION  2016   CESAREAN SECTION     c/s times two    CESAREAN SECTION     x 2   LAPAROSCOPIC VAGINAL HYSTERECTOMY WITH SALPINGECTOMY Bilateral 04/12/2015   Procedure: LAPAROSCOPIC ASSISTED VAGINAL HYSTERECTOMY WITH SALPINGECTOMY;  Surgeon: Eldred Manges, MD;  Location: [facility];  Service: Gynecology;  Laterality: Bilateral;   NASAL SEPTUM SURGERY      FAMILY HISTORY: Family History  Problem Relation Age of Onset   Hypertension Mother    Hyperlipidemia Mother    Heart disease Mother    Thyroid disease Mother    Obesity Mother    Cancer Father    Stroke Father    Obesity Father    Cancer  Sister    Hyperlipidemia Sister    Parkinsonism Maternal Grandmother    Parkinsonism Maternal Grandfather    Cancer Paternal Grandmother    Hyperlipidemia Sister    Hypertension Sister    Breast cancer Neg Hx     SOCIAL HISTORY: Social History   Socioeconomic History   Marital status: Married    Spouse name: [name]   Number of children: 2   Years of education: Not on file   Highest education level: Not on file  Occupational History   Occupation: Insurance underwriter  Tobacco Use   Smoking status: Never   Smokeless tobacco: Never  Vaping Use   Vaping Use: Never used  Substance and Sexual Activity   Alcohol use: Yes    Comment: occasional    Drug use: No   Sexual activity: Yes    Birth  control/protection: Surgical  Other Topics Concern   Not on file  Social History Narrative   Not on file   Social Determinants of Health   Financial Resource Strain: Not on file  Food Insecurity: Not on file  Transportation Needs: Not on file  Physical Activity: Not on file  Stress: Not on file  Social Connections: Not on file  Intimate Partner Violence: Not on file      PHYSICAL EXAM Generalized: Well developed, in no acute distress   Neurological examination  Mentation: Alert oriented to time, place, history taking. Follows all commands speech and language fluent Cranial nerve II-XII:Extraocular movements were full. Facial symmetry noted. uvula tongue midline. Head turning and shoulder shrug  were normal and symmetric. Gait and station: Patient is able to stand from a seated position. gait is normal.  Reflexes: UTA  DIAGNOSTIC DATA (LABS, IMAGING, TESTING) - I reviewed patient records, labs, notes, testing and imaging myself where available.  Lab Results  Component Value Date   WBC 8.9 06/12/2018   HGB 13.4 06/12/2018   HCT 39.3 06/12/2018   MCV 88 06/12/2018   PLT 261 04/13/2015      Component Value Date/Time   NA 140 12/16/2018 1206   K 4.4 12/16/2018 1206   CL 100 12/16/2018 1206   CO2 25 12/16/2018 1206   GLUCOSE 89 12/16/2018 1206   GLUCOSE 98 04/12/2015 0835   BUN 18 12/16/2018 1206   CREATININE 0.86 12/16/2018 1206   CALCIUM 10.2 12/16/2018 1206   PROT 7.0 12/16/2018 1206   ALBUMIN 4.6 12/16/2018 1206   AST 17 12/16/2018 1206   ALT 20 12/16/2018 1206   ALKPHOS 97 12/16/2018 1206   BILITOT 0.3 12/16/2018 1206   GFRNONAA 76 12/16/2018 1206   GFRAA 87 12/16/2018 1206   Lab Results  Component Value Date   CHOL 170 12/16/2018   HDL 51 12/16/2018   LDLCALC 96 12/16/2018   TRIG 114 12/16/2018   Lab Results  Component Value Date   HGBA1C 5.3 12/16/2018   Lab Results  Component Value Date   VITAMINB12 761 06/12/2018   Lab Results  Component  Value Date   TSH 3.950 06/12/2018      ASSESSMENT AND PLAN 60 y.o. year old female  has a past medical history of Barrett esophagus, Cancer ([place]), Edema, GERD (gastroesophageal reflux disease), Hyperlipidemia, Hypertension, Sleep apnea, and Swelling. here with:  OSA on CPAP  CPAP compliance excellent Residual AHI is good Encouraged patient to continue using CPAP nightly and > 4 hours each night F/U in 1 year or sooner if needed    Ward Givens, MSN, NP-C 04/05/2021, 1:22  PM [facility] [address], [place], [place] [address] [phone]

## 2021-04-05 ENCOUNTER — Telehealth (INDEPENDENT_AMBULATORY_CARE_PROVIDER_SITE_OTHER): Payer: No Typology Code available for payment source | Admitting: Adult Health

## 2021-04-05 DIAGNOSIS — Z9989 Dependence on other enabling machines and devices: Secondary | ICD-10-CM | POA: Diagnosis not present

## 2021-04-05 DIAGNOSIS — G4733 Obstructive sleep apnea (adult) (pediatric): Secondary | ICD-10-CM

## 2021-08-26 IMAGING — MG MM DIGITAL SCREENING BILAT W/ TOMO AND CAD
8 series · 8 of 24 positions shown · non-contrast
Comparison: Previous exam(s).

CLINICAL DATA: Screening.

EXAM:
DIGITAL SCREENING BILATERAL MAMMOGRAM WITH TOMOSYNTHESIS AND CAD
TECHNIQUE: Bilateral screening digital craniocaudal and mediolateral oblique
mammograms were obtained. Bilateral screening digital breast
tomosynthesis was performed. The images were evaluated with
computer-aided detection.

[L CC synth-2D]
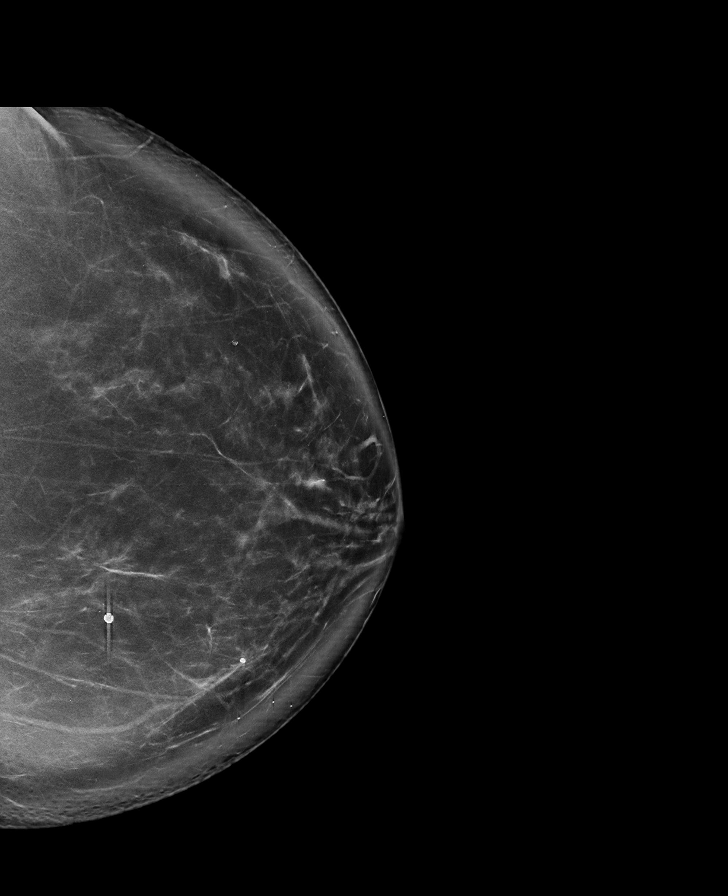

[R MLO synth-2D]
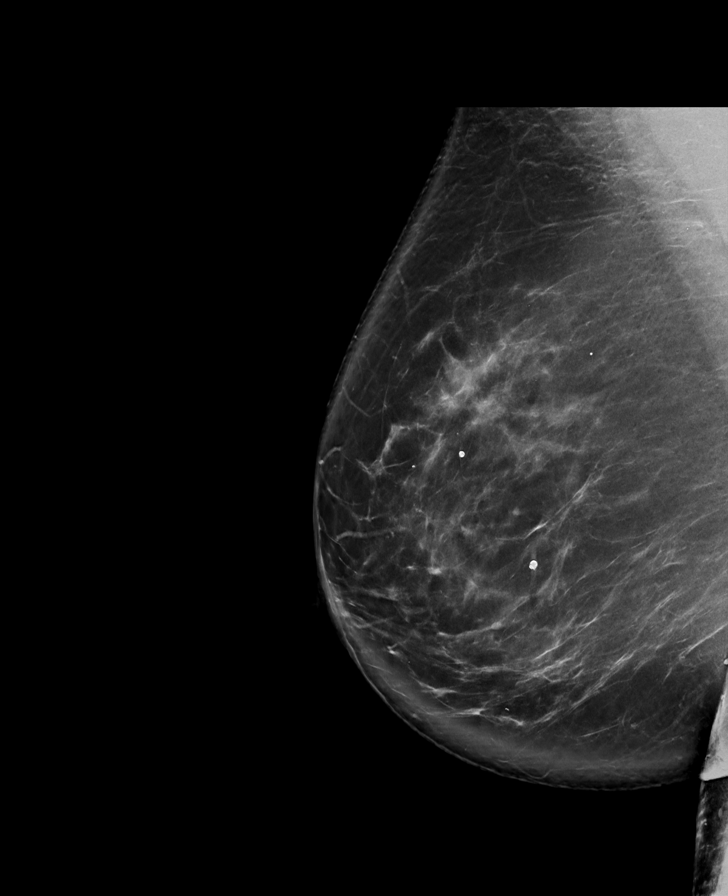

[L MLO synth-2D]
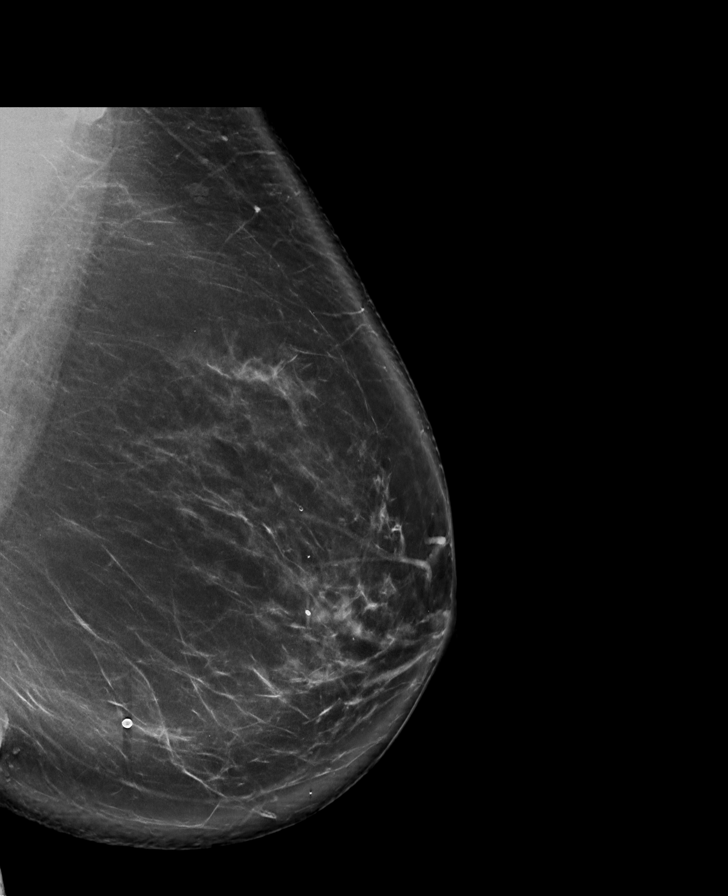

[R CC synth-2D]
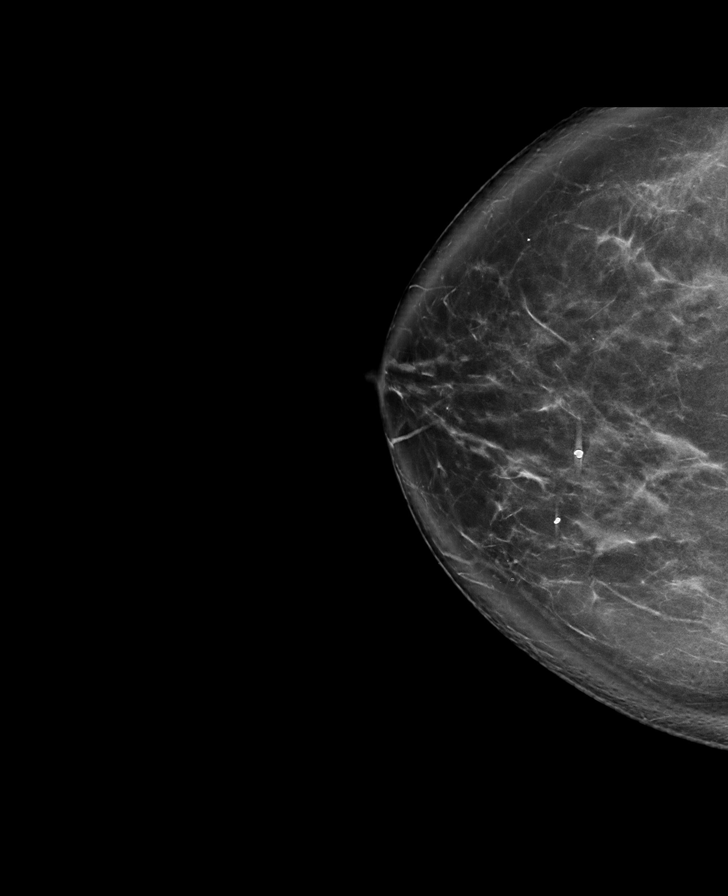

[R MLO tomo · tomo slice 55/110.0]
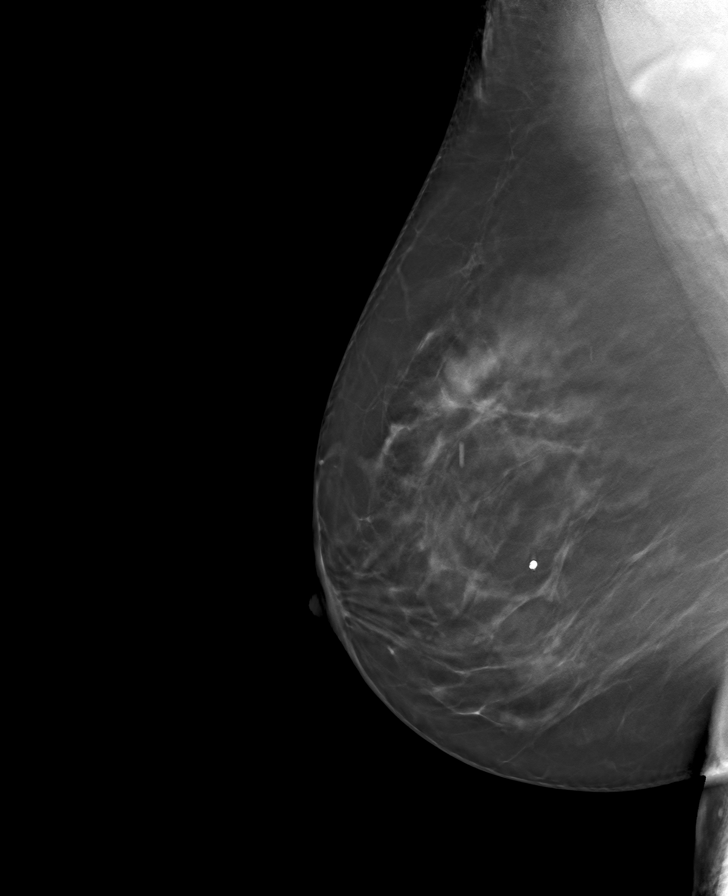

[R CC tomo · tomo slice 50/99.0]
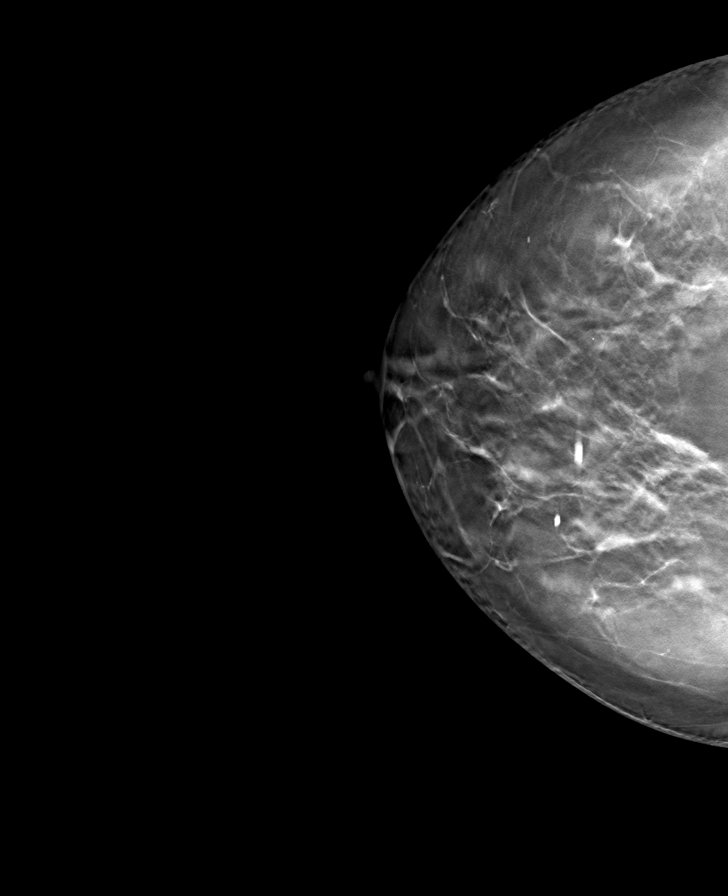

[L CC tomo · tomo slice 53/106.0]
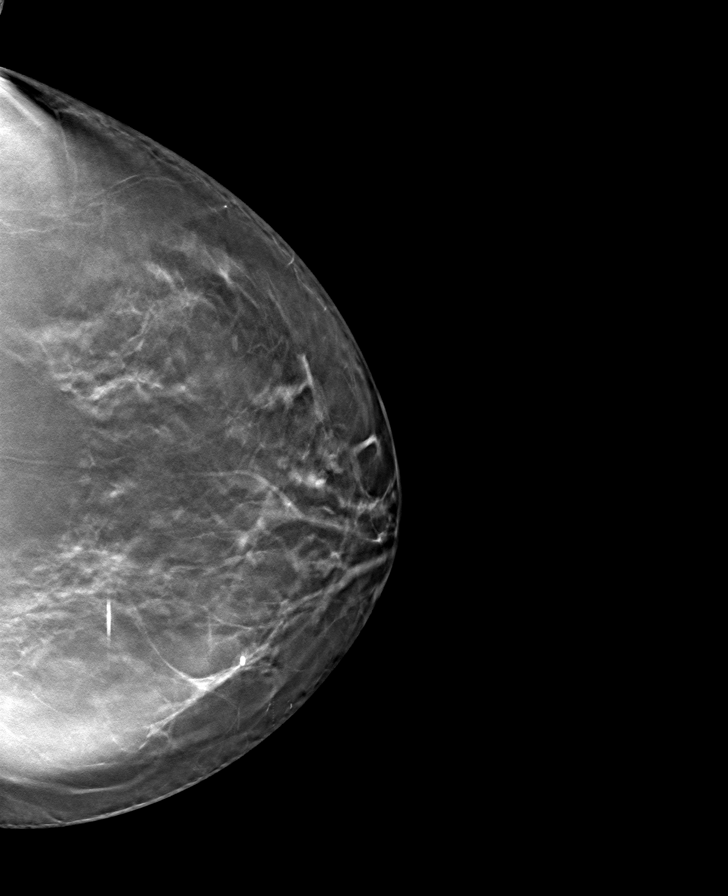

[L MLO tomo · tomo slice 55/109.0]
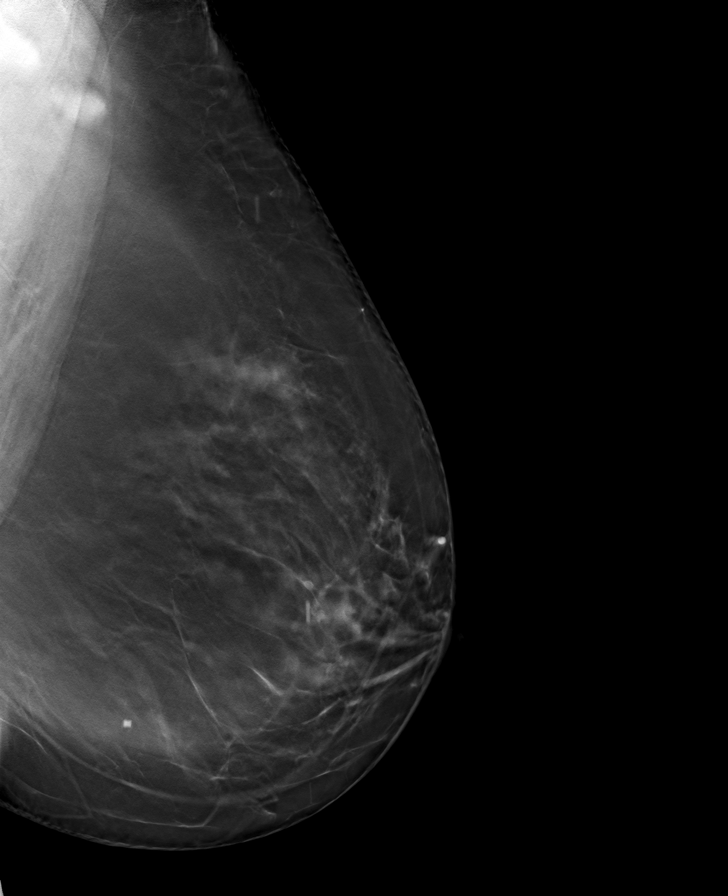

[8 of 24 positions shown; findings below may reference images not displayed]

ACR Breast Density Category b: There are scattered areas of
fibroglandular density.
FINDINGS: There are no findings suspicious for malignancy.
IMPRESSION: No mammographic evidence of malignancy. A result letter of this
screening mammogram will be mailed directly to the patient.

RECOMMENDATION:
Screening mammogram in one year. (Code:51-O-LD2)

BI-RADS CATEGORY  1: Negative.

## 2021-11-28 ENCOUNTER — Other Ambulatory Visit: Payer: Self-pay | Admitting: Internal Medicine

## 2021-11-28 DIAGNOSIS — E785 Hyperlipidemia, unspecified: Secondary | ICD-10-CM

## 2021-12-21 ENCOUNTER — Other Ambulatory Visit: Payer: Self-pay | Admitting: Internal Medicine

## 2021-12-21 DIAGNOSIS — Z1231 Encounter for screening mammogram for malignant neoplasm of breast: Secondary | ICD-10-CM

## 2021-12-28 ENCOUNTER — Encounter (INDEPENDENT_AMBULATORY_CARE_PROVIDER_SITE_OTHER): Payer: Self-pay

## 2022-01-03 ENCOUNTER — Ambulatory Visit
Admission: RE | Admit: 2022-01-03 | Discharge: 2022-01-03 | Disposition: A | Discharge status: Home / Self Care | Payer: No Typology Code available for payment source | Source: Ambulatory Visit | Attending: Internal Medicine | Admitting: Internal Medicine

## 2022-01-03 DIAGNOSIS — Z1231 Encounter for screening mammogram for malignant neoplasm of breast: Secondary | ICD-10-CM

## 2022-02-09 ENCOUNTER — Ambulatory Visit
Admission: RE | Admit: 2022-02-09 | Discharge: 2022-02-09 | Disposition: A | Discharge status: Home / Self Care | Payer: No Typology Code available for payment source | Source: Ambulatory Visit | Attending: Internal Medicine | Admitting: Internal Medicine

## 2022-02-09 DIAGNOSIS — E785 Hyperlipidemia, unspecified: Secondary | ICD-10-CM

## 2022-04-03 ENCOUNTER — Encounter: Payer: Self-pay | Admitting: *Deleted

## 2022-04-04 ENCOUNTER — Telehealth: Payer: No Typology Code available for payment source | Admitting: Adult Health

## 2022-12-08 ENCOUNTER — Telehealth: Payer: Self-pay | Admitting: Genetic Counselor

## 2022-12-08 NOTE — Telephone Encounter (Signed)
Left patient message regarding Genetics appointment, left callback number if patient needed to rescheduled

## 2023-01-04 ENCOUNTER — Inpatient Hospital Stay: Payer: No Typology Code available for payment source

## 2023-01-04 ENCOUNTER — Other Ambulatory Visit: Payer: Self-pay | Admitting: Genetic Counselor

## 2023-01-04 ENCOUNTER — Inpatient Hospital Stay: Payer: No Typology Code available for payment source | Admitting: Genetic Counselor

## 2023-01-04 DIAGNOSIS — Z1379 Encounter for other screening for genetic and chromosomal anomalies: Secondary | ICD-10-CM

## 2023-01-04 DIAGNOSIS — Z800 Family history of malignant neoplasm of digestive organs: Secondary | ICD-10-CM

## 2023-01-04 DIAGNOSIS — Z85828 Personal history of other malignant neoplasm of skin: Secondary | ICD-10-CM

## 2023-01-04 DIAGNOSIS — Z8051 Family history of malignant neoplasm of kidney: Secondary | ICD-10-CM

## 2023-01-04 LAB — GENETIC SCREENING ORDER

## 2023-01-05 ENCOUNTER — Encounter: Payer: Self-pay | Admitting: Genetic Counselor

## 2023-01-05 NOTE — Progress Notes (Signed)
REFERRING PROVIDER: Rodrigo Ran, MD [address] [place],  [place] [address]  PRIMARY PROVIDER:  Rodrigo Ran, MD  PRIMARY REASON FOR VISIT:  1. Family history of colon cancer   2. Family history of kidney cancer    HISTORY OF PRESENT ILLNESS:   [name], a 62 y.o. female, was seen for a [facility] cancer genetics consultation at the request of Dr. Waynard Edwards due to a family history of cancer.  [name] presents to clinic today to discuss the possibility of a hereditary predisposition to cancer, to discuss genetic testing, and to further clarify her future cancer risks, as well as potential cancer risks for family members.   [name] reportedly has a personal history of basal cell carcinoma diagnosed in 2016.   RISK FACTORS:  Menarche was at age 55.  First live birth at age 37.  OCP use for approximately 10 years.  Ovaries intact: yes.  Uterus intact: no.  Menopausal status: postmenopausal, menopause at 53  HRT use: 0 years. Colonoscopy: yes;  colonoscopy 4 years ago, reports a couple benign polyps . Mammogram within the last year: yes. Number of breast biopsies: 0. Up to date with pelvic exams: yes. Any excessive radiation exposure in the past: no  Past Medical History:  Diagnosis Date   Barrett esophagus    Cancer (HCC)    basal cell on scalp    Edema    GERD (gastroesophageal reflux disease)    takes nexium     Hyperlipidemia    Hypertension    Sleep apnea    on CPAP   Swelling     Past Surgical History:  Procedure Laterality Date   BASAL CELL CARCINOMA EXCISION  2016   CESAREAN SECTION     c/s times two    CESAREAN SECTION     x 2   LAPAROSCOPIC VAGINAL HYSTERECTOMY WITH SALPINGECTOMY Bilateral 04/12/2015   Procedure: LAPAROSCOPIC ASSISTED VAGINAL HYSTERECTOMY WITH SALPINGECTOMY;  Surgeon: Hal Morales, MD;  Location: [facility];  Service: Gynecology;  Laterality: Bilateral;   NASAL SEPTUM SURGERY      Social History   Socioeconomic History    Marital status: Married    Spouse name: Ashten Scharrer   Number of children: 2   Years of education: Not on file   Highest education level: Not on file  Occupational History   Occupation: Accounting Admin  Tobacco Use   Smoking status: Never   Smokeless tobacco: Never  Vaping Use   Vaping status: Never Used  Substance and Sexual Activity   Alcohol use: Yes    Comment: occasional    Drug use: No   Sexual activity: Yes    Birth control/protection: Surgical  Other Topics Concern   Not on file  Social History Narrative   Not on file   Social Determinants of Health   Financial Resource Strain: Not on file  Food Insecurity: Not on file  Transportation Needs: Not on file  Physical Activity: Not on file  Stress: Not on file  Social Connections: Not on file     FAMILY HISTORY:  We obtained a detailed, 4-generation family history.  Significant diagnoses are listed below: Family History  Problem Relation Age of Onset   Hypertension Mother    Hyperlipidemia Mother    Heart disease Mother    Thyroid disease Mother    Obesity Mother    Esophageal cancer Father 43       met to stomach   Stroke Father  Obesity Father    Kidney cancer Sister 42       bilateral   Hyperlipidemia Sister    Hyperlipidemia Sister    Hypertension Sister    Lymphoma Maternal Aunt 85 - 58   Cancer Maternal Aunt 27 - 90       unknown, possibly colon   Testicular cancer Maternal Uncle 60 - 10   Parkinsonism Maternal Grandmother    Parkinsonism Maternal Grandfather    Colon cancer Paternal Grandmother 23 - 27   Multiple myeloma Niece 31   Breast cancer Neg Hx    Ms. Cada is unaware of previous family history of genetic testing for hereditary cancer risks. There is no reported Ashkenazi Jewish ancestry.      GENETIC COUNSELING ASSESSMENT: [name] is a 62 y.o. female with a family history of cancer which is somewhat suggestive of a hereditary predisposition to cancer. We, therefore,  discussed and recommended the following at today's visit.   DISCUSSION: We discussed that 5 - 10% of cancer is hereditary, with most cases of hereditary colon cancer associated with Lynch Syndrome. There are other genes that can be associated with hereditary colon and kidney cancer syndromes.  We discussed that testing is beneficial for several reasons, including knowing about other cancer risks, identifying potential screening and risk-reduction options that may be appropriate, and to understanding if other family members could be at risk for cancer and allowing them to undergo genetic testing.  We reviewed the characteristics, features and inheritance patterns of hereditary cancer syndromes. We also discussed genetic testing, including the appropriate family members to test, the process of testing, insurance coverage and turn-around-time for results. We discussed the implications of a negative, positive, carrier and/or variant of uncertain significant result. We discussed that negative results would be uninformative given that Ms. Reifsteck does not have a personal history of cancer.   Ms. Chirdon was offered a common hereditary cancer panel (34 genes) and an expanded pan-cancer panel (71 genes). Ms. Lagorio was informed of the benefits and limitations of each panel, including that expanded pan-cancer panels contain several genes that do not have clear management guidelines at this point in time.  We also discussed that as the number of genes included on a panel increases, the chances of variants of uncertain significance increases.  After considering the benefits and limitations of each gene panel, Ms. Andreason elected to have [facility] CancerNext-Expanded Panel.  The CancerNext-Expanded gene panel offered by [facility] and includes sequencing, rearrangement, and RNA analysis for the following 71 genes: AIP, ALK, APC, ATM, AXIN2, BAP1, BARD1, BMPR1A, BRCA1, BRCA2, BRIP1, CDC73, CDH1, CDK4, CDKN1B, CDKN2A,  CHEK2, CTNNA1, DICER1, FH, FLCN, KIF1B, LZTR1, MAX, MEN1, MET, MLH1, MSH2, MSH3, MSH6, MUTYH, NF1, NF2, NTHL1, PALB2, PHOX2B, PMS2, POT1, PRKAR1A, PTCH1, PTEN, RAD51C, RAD51D, RB1, RET, SDHA, SDHAF2, SDHB, SDHC, SDHD, SMAD4, SMARCA4, SMARCB1, SMARCE1, STK11, SUFU, TMEM127, TP53, TSC1, TSC2, and VHL (sequencing and deletion/duplication); EGFR, EGLN1, HOXB13, KIT, MITF, PDGFRA, POLD1, and POLE (sequencing only); EPCAM and GREM1 (deletion/duplication only).    Based on Ms. Guider personal history of cancer, she meets medical criteria for genetic testing. Despite that she meets criteria, she may still have an out of pocket cost. We discussed that if her out of pocket cost for testing is over $100, the laboratory will call and confirm whether she wants to proceed with testing.  If the out of pocket cost of testing is less than $100 she will be billed by the genetic testing laboratory.  We discussed that some people do not want to undergo genetic testing due to fear of genetic discrimination.  A federal law called the Genetic Information Non-Discrimination Act (GINA) of 2008 helps protect individuals against genetic discrimination based on their genetic test results.  It impacts both health insurance and employment.  With health insurance, it protects against increased premiums, being kicked off insurance or being forced to take a test in order to be insured.  For employment it protects against hiring, firing and promoting decisions based on genetic test results.  GINA does not apply to those in the [employer], those who work for companies with less than 15 employees, and new life insurance or long-term disability insurance policies.  Health status due to a cancer diagnosis is not protected under GINA.  PLAN: After considering the risks, benefits, and limitations, Ms. Landuyt provided informed consent to pursue genetic testing and the blood sample was sent to [facility] for analysis of the  CancerNext-Expanded Panel. Results should be available within approximately 2-3 weeks' time, at which point they will be disclosed by telephone to Ms. Manco, as will any additional recommendations warranted by these results. Ms. Hollenshead will receive a summary of her genetic counseling visit and a copy of her results once available. This information will also be available in Epic.   Ms. Cafarelli questions were answered to her satisfaction today. Our contact information was provided should additional questions or concerns arise. Thank you for the referral and allowing [name] to share in the care of your patient.   Lalla Brothers, MS, [facility] Genetic Counselor [email or web address] (P) [phone]  The patient was seen for a total of 35 minutes in face-to-face genetic counseling. The patient was seen alone.  Drs. Pamelia Hoit and/or Mosetta Putt were available to discuss this case as needed.  _______________________________________________________________________ For Office Staff:  Number of people involved in session: 1 Was an Intern/ student involved with case: no

## 2023-01-15 ENCOUNTER — Encounter: Payer: Self-pay | Admitting: Genetic Counselor

## 2023-01-15 ENCOUNTER — Telehealth: Payer: Self-pay | Admitting: Genetic Counselor

## 2023-01-15 DIAGNOSIS — Z1379 Encounter for other screening for genetic and chromosomal anomalies: Secondary | ICD-10-CM | POA: Insufficient documentation

## 2023-01-15 NOTE — Telephone Encounter (Signed)
I contacted Ms. Hodgson to discuss her genetic testing results. No pathogenic variants were identified in the 71 genes analyzed. Detailed clinic note to follow.  The test report has been scanned into EPIC and is located under the Molecular Pathology section of the Results Review tab.  A portion of the result report is included below for reference.   Lalla Brothers, MS, [facility] Genetic Counselor [email or web address] (P) [phone]

## 2023-01-18 ENCOUNTER — Other Ambulatory Visit: Payer: Self-pay | Admitting: Internal Medicine

## 2023-01-18 DIAGNOSIS — Z1231 Encounter for screening mammogram for malignant neoplasm of breast: Secondary | ICD-10-CM

## 2023-01-31 ENCOUNTER — Encounter: Payer: Self-pay | Admitting: Genetic Counselor

## 2023-01-31 ENCOUNTER — Ambulatory Visit
Admission: RE | Admit: 2023-01-31 | Discharge: 2023-01-31 | Disposition: A | Discharge status: Home / Self Care | Payer: No Typology Code available for payment source | Source: Ambulatory Visit

## 2023-01-31 ENCOUNTER — Ambulatory Visit: Payer: Self-pay | Admitting: Genetic Counselor

## 2023-01-31 DIAGNOSIS — Z1231 Encounter for screening mammogram for malignant neoplasm of breast: Secondary | ICD-10-CM

## 2023-01-31 DIAGNOSIS — Z1379 Encounter for other screening for genetic and chromosomal anomalies: Secondary | ICD-10-CM

## 2023-01-31 NOTE — Progress Notes (Signed)
HPI:   [name] was previously seen in the [facility] due to a family history of cancer and concerns regarding a hereditary predisposition to cancer. Please refer to our prior cancer genetics clinic note for more information regarding our discussion, assessment and recommendations, at the time. [name] recent genetic test results were disclosed to her, as were recommendations warranted by these results. These results and recommendations are discussed in more detail below.  FAMILY HISTORY:  We obtained a detailed, 4-generation family history.  Significant diagnoses are listed below:      Family History  Problem Relation Age of Onset   Hypertension Mother     Hyperlipidemia Mother     Heart disease Mother     Thyroid disease Mother     Obesity Mother     Esophageal cancer Father 1        met to stomach   Stroke Father     Obesity Father     Kidney cancer Sister 62        bilateral   Hyperlipidemia Sister     Hyperlipidemia Sister     Hypertension Sister     Lymphoma Maternal Aunt 67 - 86   Cancer Maternal Aunt 3 - 29        unknown, possibly colon   Testicular cancer Maternal Uncle 16 - 20   Parkinsonism Maternal Grandmother     Parkinsonism Maternal Grandfather     Colon cancer Paternal Grandmother 68 - 23   Multiple myeloma Niece 59   Breast cancer Neg Hx          [name] is unaware of previous family history of genetic testing for hereditary cancer risks. There is no reported Ashkenazi Jewish ancestry.         GENETIC TEST RESULTS:  The [facility] CancerNext-Expanded Panel found no pathogenic mutations.   The CancerNext-Expanded gene panel offered by [facility] and includes sequencing, rearrangement, and RNA analysis for the following 71 genes: AIP, ALK, APC, ATM, AXIN2, BAP1, BARD1, BMPR1A, BRCA1, BRCA2, BRIP1, CDC73, CDH1, CDK4, CDKN1B, CDKN2A, CHEK2, CTNNA1, DICER1, FH, FLCN, KIF1B, LZTR1, MAX, MEN1, MET, MLH1, MSH2, MSH3, MSH6,  MUTYH, NF1, NF2, NTHL1, PALB2, PHOX2B, PMS2, POT1, PRKAR1A, PTCH1, PTEN, RAD51C, RAD51D, RB1, RET, SDHA, SDHAF2, SDHB, SDHC, SDHD, SMAD4, SMARCA4, SMARCB1, SMARCE1, STK11, SUFU, TMEM127, TP53, TSC1, TSC2, and VHL (sequencing and deletion/duplication); EGFR, EGLN1, HOXB13, KIT, MITF, PDGFRA, POLD1, and POLE (sequencing only); EPCAM and GREM1 (deletion/duplication only).    The test report has been scanned into EPIC and is located under the Molecular Pathology section of the Results Review tab.  A portion of the result report is included below for reference. Genetic testing reported out on 01/14/2023.       Even though a pathogenic variant was not identified, possible explanations for the cancer in the family may include: There may be no hereditary risk for cancer in the family. The cancers in her family may be due to other genetic or environmental factors. There may be a gene mutation in one of these genes that current testing methods cannot detect, but that chance is small. There could be another gene that has not yet been discovered, or that we have not yet tested, that is responsible for the cancer diagnoses in the family.  It is also possible there is a hereditary cause for the cancer in the family that [name] did not inherit.  Therefore, it is important to remain in touch with cancer genetics  in the future so that we can continue to offer [name] the most up to date genetic testing.   ADDITIONAL GENETIC TESTING:  We discussed with [name] that her genetic testing was fairly extensive.  If there are genes identified to increase cancer risk that can be analyzed in the future, we would be happy to discuss and coordinate this testing at that time.    CANCER SCREENING RECOMMENDATIONS:  [name] test result is considered negative (normal).  This means that we have not identified a hereditary cause for her family history of cancer at this time.   An individual's cancer risk and  medical management are not determined by genetic test results alone. Overall cancer risk assessment incorporates additional factors, including personal medical history, family history, and any available genetic information that may result in a personalized plan for cancer prevention and surveillance. Therefore, it is recommended she continue to follow the cancer management and screening guidelines provided by her primary healthcare provider.  RECOMMENDATIONS FOR FAMILY MEMBERS:   Since she did not inherit a mutation in a cancer predisposition gene included on this panel, her children could not have inherited a mutation from her in one of these genes. Other members of the family may still carry a pathogenic variant in one of these genes that [name] did not inherit. Based on the family history, we recommend her sister, who was diagnosed with bilateral kidney cancer, have genetic counseling and testing.   FOLLOW-UP:  Cancer genetics is a rapidly advancing field and it is possible that new genetic tests will be appropriate for her and/or her family members in the future. We encouraged her to remain in contact with cancer genetics on an annual basis so we can update her personal and family histories and let her know of advances in cancer genetics that may benefit this family.   Our contact number was provided. [name] questions were answered to her satisfaction, and she knows she is welcome to call us at anytime with additional questions or concerns.   Lalla Brothers, MS, [facility] Genetic Counselor [email or web address] (P) [phone]

## 2023-02-21 NOTE — Progress Notes (Unsigned)
PATIENT: Tracy PAULES DOB: [birth year]  REASON FOR VISIT: follow up HISTORY FROM: patient PRIMARY NEUROLOGIST: Dr. Vickey Huger  Virtual Visit via Video Note  I connected with [name] on 02/21/23 at  8:30 AM EDT by a video enabled telemedicine application located remotely at [facility] and verified that I am speaking with the correct person using two identifiers who was located at their own home.   I discussed the limitations of evaluation and management by telemedicine and the availability of in person appointments. The patient expressed understanding and agreed to proceed.   PATIENT: [name] DOB: 11-Oct-1960  REASON FOR VISIT: follow up HISTORY FROM: patient  Chief Complaint  Patient presents with   Follow-up    Pt in 16 Pt here for CPAP f/u Pt states no questions or concerns for today's visit      HISTORY OF PRESENT ILLNESS: Today 02/21/23:  [name] is a 62 y.o. female with a history of OSA on CPAP. Returns today for follow-up. Reports that CPAP is working well. Denies any new issues. She could get a second machine as her setup date was in 2018.      04/05/21: [name] is a 62 year old female with a history of obstructive sleep apnea on CPAP.  She returns today for follow-up.  She reports that the CPAP is working well for her.  She denies any new issues.  She returns today for an evaluation.    REVIEW OF SYSTEMS: Out of a complete 14 system review of symptoms, the patient complains only of the following symptoms, and all other reviewed systems are negative.   ALLERGIES: Allergies  Allergen Reactions   Tape Other (See Comments)    Blisters and makes her skin peel     Ciprofloxacin Nausea Only    HOME MEDICATIONS: Outpatient Medications Prior to Visit  Medication Sig Dispense Refill   atorvastatin (LIPITOR) 10 MG tablet Take 10 mg by mouth daily.     Calcium Carb-Cholecalciferol (CALCIUM + D3) 600-200  MG-UNIT TABS Take 1 tablet by mouth daily. (Patient not taking: Reported on 03/21/2021)     cholecalciferol (VITAMIN D3) 25 MCG (1000 UT) tablet Take 5,000 Units by mouth daily.      esomeprazole (NEXIUM) 20 MG capsule Take 20 mg by mouth daily at 12 noon.     levothyroxine (SYNTHROID) 25 MCG tablet Take 25 mcg by mouth every morning.     Multiple Vitamin (MULTIVITAMIN WITH MINERALS) TABS tablet Take 1 tablet by mouth daily.     nebivolol (BYSTOLIC) 5 MG tablet Take 5 mg by mouth daily.     olmesartan (BENICAR) 20 MG tablet TAKE 1 (ONE) TABLET DAILY  5   No facility-administered medications prior to visit.    PAST MEDICAL HISTORY: Past Medical History:  Diagnosis Date   Barrett esophagus    Cancer (HCC)    basal cell on scalp    Edema    GERD (gastroesophageal reflux disease)    takes nexium     Hyperlipidemia    Hypertension    Sleep apnea    on CPAP   Swelling     PAST SURGICAL HISTORY: Past Surgical History:  Procedure Laterality Date   BASAL CELL CARCINOMA EXCISION  2016   CESAREAN SECTION     c/s times two    CESAREAN SECTION     x 2   LAPAROSCOPIC VAGINAL HYSTERECTOMY WITH SALPINGECTOMY Bilateral 04/12/2015   Procedure: LAPAROSCOPIC ASSISTED VAGINAL HYSTERECTOMY  WITH SALPINGECTOMY;  Surgeon: Hal Morales, MD;  Location: [facility];  Service: Gynecology;  Laterality: Bilateral;   NASAL SEPTUM SURGERY      FAMILY HISTORY: Family History  Problem Relation Age of Onset   Hypertension Mother    Hyperlipidemia Mother    Heart disease Mother    Thyroid disease Mother    Obesity Mother    Esophageal cancer Father 8       met to stomach   Stroke Father    Obesity Father    Kidney cancer Sister 51       bilateral   Hyperlipidemia Sister    Hyperlipidemia Sister    Hypertension Sister    Lymphoma Maternal Aunt 69 - 63   Cancer Maternal Aunt 58 - 35       unknown, possibly colon   Testicular cancer Maternal Uncle 41 - 65   Parkinsonism Maternal Grandmother     Parkinsonism Maternal Grandfather    Colon cancer Paternal Grandmother 27 - 35   Multiple myeloma Niece 67   Breast cancer Neg Hx     SOCIAL HISTORY: Social History   Socioeconomic History   Marital status: Married    Spouse name: Irena Gaydos   Number of children: 2   Years of education: Not on file   Highest education level: Not on file  Occupational History   Occupation: Radiation protection practitioner  Tobacco Use   Smoking status: Never   Smokeless tobacco: Never  Vaping Use   Vaping status: Never Used  Substance and Sexual Activity   Alcohol use: Yes    Comment: occasional    Drug use: No   Sexual activity: Yes    Birth control/protection: Surgical  Other Topics Concern   Not on file  Social History Narrative   Not on file   Social Determinants of Health   Financial Resource Strain: Not on file  Food Insecurity: Not on file  Transportation Needs: Not on file  Physical Activity: Not on file  Stress: Not on file  Social Connections: Not on file  Intimate Partner Violence: Not on file      PHYSICAL EXAM Generalized: Well developed, in no acute distress   Neurological examination  Mentation: Alert oriented to time, place, history taking. Follows all commands speech and language fluent Cranial nerve II-XII: Facial symmetry noted  DIAGNOSTIC DATA (LABS, IMAGING, TESTING) - I reviewed patient records, labs, notes, testing and imaging myself where available.  Lab Results  Component Value Date   WBC 8.9 06/12/2018   HGB 13.4 06/12/2018   HCT 39.3 06/12/2018   MCV 88 06/12/2018   PLT 261 04/13/2015      Component Value Date/Time   NA 140 12/16/2018 1206   K 4.4 12/16/2018 1206   CL 100 12/16/2018 1206   CO2 25 12/16/2018 1206   GLUCOSE 89 12/16/2018 1206   GLUCOSE 98 04/12/2015 0835   BUN 18 12/16/2018 1206   CREATININE 0.86 12/16/2018 1206   CALCIUM 10.2 12/16/2018 1206   PROT 7.0 12/16/2018 1206   ALBUMIN 4.6 12/16/2018 1206   AST 17 12/16/2018 1206   ALT  20 12/16/2018 1206   ALKPHOS 97 12/16/2018 1206   BILITOT 0.3 12/16/2018 1206   GFRNONAA 76 12/16/2018 1206   GFRAA 87 12/16/2018 1206   Lab Results  Component Value Date   CHOL 170 12/16/2018   HDL 51 12/16/2018   LDLCALC 96 12/16/2018   TRIG 114 12/16/2018   Lab Results  Component Value Date  HGBA1C 5.3 12/16/2018   Lab Results  Component Value Date   VITAMINB12 761 06/12/2018   Lab Results  Component Value Date   TSH 3.950 06/12/2018      ASSESSMENT AND PLAN 62 y.o. year old female  has a past medical history of Barrett esophagus, Cancer (HCC), Edema, GERD (gastroesophageal reflux disease), Hyperlipidemia, Hypertension, Sleep apnea, and Swelling. here with:  OSA on CPAP  CPAP compliance excellent Residual AHI is good Encouraged patient to continue using CPAP nightly and > 4 hours each night Advised that if she would like to get a replacement machine we would need to repeat home sleep test.  She voiced understanding.  She will let us know when she would like to proceed F/U in 1 year or sooner if needed    Butch Penny, MSN, NP-C 02/21/2023, 5:08 PM [facility] [address] [place], [place] [address] [phone]

## 2023-02-22 ENCOUNTER — Ambulatory Visit (INDEPENDENT_AMBULATORY_CARE_PROVIDER_SITE_OTHER): Payer: No Typology Code available for payment source | Admitting: Adult Health

## 2023-02-22 ENCOUNTER — Encounter: Payer: Self-pay | Admitting: Adult Health

## 2023-02-22 VITALS — BP 119/71 | HR 95 | Ht 70.0 in | Wt 259.8 lb

## 2023-02-22 DIAGNOSIS — G4733 Obstructive sleep apnea (adult) (pediatric): Secondary | ICD-10-CM | POA: Diagnosis not present

## 2023-02-22 NOTE — Patient Instructions (Addendum)
Your Plan:  Continue using CPAP nightly and greater than 4 hours each night Please let us know if you would like a replacement machine.  We would need to repeat a home sleep test first. If your symptoms worsen or you develop new symptoms please let us know.    Thank you for coming to see [name] at [facility]. I hope we have been able to provide you high quality care today.  You may receive a patient satisfaction survey over the next few weeks. We would appreciate your feedback and comments so that we may continue to improve ourselves and the health of our patients.

## 2023-12-25 ENCOUNTER — Other Ambulatory Visit: Payer: Self-pay | Admitting: Internal Medicine

## 2023-12-25 DIAGNOSIS — Z1231 Encounter for screening mammogram for malignant neoplasm of breast: Secondary | ICD-10-CM

## 2024-02-01 ENCOUNTER — Ambulatory Visit
Admission: RE | Admit: 2024-02-01 | Discharge: 2024-02-01 | Disposition: A | Discharge status: Home / Self Care | Source: Ambulatory Visit | Attending: Internal Medicine | Admitting: Internal Medicine

## 2024-02-01 DIAGNOSIS — Z1231 Encounter for screening mammogram for malignant neoplasm of breast: Secondary | ICD-10-CM

## 2024-03-04 ENCOUNTER — Telehealth: Payer: No Typology Code available for payment source | Admitting: Adult Health

## 2024-03-04 DIAGNOSIS — G4733 Obstructive sleep apnea (adult) (pediatric): Secondary | ICD-10-CM

## 2024-03-04 NOTE — Patient Instructions (Signed)
 Continue using CPAP nightly and greater than 4 hours each night Home sleep test ordered If your symptoms worsen or you develop new symptoms please let us know.

## 2024-03-04 NOTE — Progress Notes (Signed)
 PATIENT: [name] DOB: Mar 13, 1961  REASON FOR VISIT: follow up HISTORY FROM: patient PRIMARY NEUROLOGIST: Dr. Chalice  Virtual Visit via Video Note  I connected with [name] on 03/04/24 at  1:00 PM EDT by a video enabled telemedicine application located remotely at [facility] and verified that I am speaking with the correct person using two identifiers who was located at their own home.   I discussed the limitations of evaluation and management by telemedicine and the availability of in person appointments. The patient expressed understanding and agreed to proceed.   PATIENT: [name] DOB: 19-Jun-1960  REASON FOR VISIT: follow up HISTORY FROM: patient    HISTORY OF PRESENT ILLNESS: Today 03/04/24:  [name] is a 63 y.o. female with a history of OSA on CPAP. Returns today for follow-up.  She reports that the CPAP is working well.  She does state that her machine is older.  And would like a new machine.  Her last sleep study was in 2018 that showed moderate to severe sleep apnea.  She states that she has lost 50 pounds but reports that she had gained weight after her last sleep study.  She currently is still at the same weight that she was when she had a sleep study.  She wears the nasal pillows for mask.  Her download is below     02/22/23: [name] is a 63 y.o. female with a history of OSA on CPAP. Returns today for follow-up. Reports that CPAP is working well. Denies any new issues. She could get a second machine as her setup date was in 2018.      04/05/21: [name] is a 63 year old female with a history of obstructive sleep apnea on CPAP.  She returns today for follow-up.  She reports that the CPAP is working well for her.  She denies any new issues.  She returns today for an evaluation.    REVIEW OF SYSTEMS: Out of a complete 14 system review of symptoms, the patient complains only of the following  symptoms, and all other reviewed systems are negative.   ALLERGIES: Allergies  Allergen Reactions   Tape Other (See Comments)    Blisters and makes her skin peel     Ciprofloxacin Nausea Only    HOME MEDICATIONS: Outpatient Medications Prior to Visit  Medication Sig Dispense Refill   atorvastatin (LIPITOR) 10 MG tablet Take 10 mg by mouth daily.     Calcium Carb-Cholecalciferol (CALCIUM + D3) 600-200 MG-UNIT TABS Take 1 tablet by mouth daily. (Patient not taking: Reported on 03/21/2021)     cholecalciferol (VITAMIN D3) 25 MCG (1000 UT) tablet Take 5,000 Units by mouth daily.      esomeprazole (NEXIUM) 20 MG capsule Take 20 mg by mouth daily at 12 noon.     levothyroxine (SYNTHROID) 25 MCG tablet Take 25 mcg by mouth every morning.     Multiple Vitamin (MULTIVITAMIN WITH MINERALS) TABS tablet Take 1 tablet by mouth daily.     nebivolol  (BYSTOLIC ) 5 MG tablet Take 5 mg by mouth daily.     olmesartan (BENICAR) 20 MG tablet TAKE 1 (ONE) TABLET DAILY  5   WEGOVY 1.7 MG/0.75ML SOAJ Inject into the skin.     No facility-administered medications prior to visit.    PAST MEDICAL HISTORY: Past Medical History:  Diagnosis Date   Barrett esophagus    Cancer (HCC)    basal cell on scalp  Edema    GERD (gastroesophageal reflux disease)    takes nexium     Hyperlipidemia    Hypertension    Sleep apnea    on CPAP   Swelling     PAST SURGICAL HISTORY: Past Surgical History:  Procedure Laterality Date   BASAL CELL CARCINOMA EXCISION  2016   CESAREAN SECTION     c/s times two    CESAREAN SECTION     x 2   LAPAROSCOPIC VAGINAL HYSTERECTOMY WITH SALPINGECTOMY Bilateral 04/12/2015   Procedure: LAPAROSCOPIC ASSISTED VAGINAL HYSTERECTOMY WITH SALPINGECTOMY;  Surgeon: Shanda SHAUNNA Muscat, MD;  Location: [facility];  Service: Gynecology;  Laterality: Bilateral;   NASAL SEPTUM SURGERY      FAMILY HISTORY: Family History  Problem Relation Age of Onset   Hypertension Mother     Hyperlipidemia Mother    Heart disease Mother    Thyroid  disease Mother    Obesity Mother    Esophageal cancer Father 44       met to stomach   Stroke Father    Obesity Father    Kidney cancer Sister 74       bilateral   Hyperlipidemia Sister    Hyperlipidemia Sister    Hypertension Sister    Lymphoma Maternal Aunt 77 - 84   Cancer Maternal Aunt 20 - 72       unknown, possibly colon   Testicular cancer Maternal Uncle 31 - 74   Parkinsonism Maternal Grandmother    Parkinsonism Maternal Grandfather    Colon cancer Paternal Grandmother 66 - 4   Multiple myeloma Niece 28   Breast cancer Neg Hx    Sleep apnea Neg Hx     SOCIAL HISTORY: Social History   Socioeconomic History   Marital status: Married    Spouse name: Zylie Mumaw   Number of children: 2   Years of education: Not on file   Highest education level: Not on file  Occupational History   Occupation: Radiation protection practitioner  Tobacco Use   Smoking status: Never   Smokeless tobacco: Never  Vaping Use   Vaping status: Never Used  Substance and Sexual Activity   Alcohol use: Yes    Comment: occasional    Drug use: No   Sexual activity: Yes    Birth control/protection: Surgical  Other Topics Concern   Not on file  Social History Narrative   Not on file   Social Drivers of Health   Financial Resource Strain: Not on file  Food Insecurity: Not on file  Transportation Needs: Not on file  Physical Activity: Not on file  Stress: Not on file  Social Connections: Not on file  Intimate Partner Violence: Not on file      PHYSICAL EXAM Generalized: Well developed, in no acute distress   Neurological examination  Mentation: Alert oriented to time, place, history taking. Follows all commands speech and language fluent Cranial nerve II-XII: Facial symmetry noted  DIAGNOSTIC DATA (LABS, IMAGING, TESTING) - I reviewed patient records, labs, notes, testing and imaging myself where available.  Lab Results  Component  Value Date   WBC 8.9 06/12/2018   HGB 13.4 06/12/2018   HCT 39.3 06/12/2018   MCV 88 06/12/2018   PLT 261 04/13/2015      Component Value Date/Time   NA 140 12/16/2018 1206   K 4.4 12/16/2018 1206   CL 100 12/16/2018 1206   CO2 25 12/16/2018 1206   GLUCOSE 89 12/16/2018 1206   GLUCOSE 98 04/12/2015 0835  BUN 18 12/16/2018 1206   CREATININE 0.86 12/16/2018 1206   CALCIUM 10.2 12/16/2018 1206   PROT 7.0 12/16/2018 1206   ALBUMIN 4.6 12/16/2018 1206   AST 17 12/16/2018 1206   ALT 20 12/16/2018 1206   ALKPHOS 97 12/16/2018 1206   BILITOT 0.3 12/16/2018 1206   GFRNONAA 76 12/16/2018 1206   GFRAA 87 12/16/2018 1206   Lab Results  Component Value Date   CHOL 170 12/16/2018   HDL 51 12/16/2018   LDLCALC 96 12/16/2018   TRIG 114 12/16/2018   Lab Results  Component Value Date   HGBA1C 5.3 12/16/2018   Lab Results  Component Value Date   VITAMINB12 761 06/12/2018   Lab Results  Component Value Date   TSH 3.950 06/12/2018      ASSESSMENT AND PLAN 63 y.o. year old female  has a past medical history of Barrett esophagus, Cancer (HCC), Edema, GERD (gastroesophageal reflux disease), Hyperlipidemia, Hypertension, Sleep apnea, and Swelling. here with:  OSA on CPAP  CPAP compliance excellent Residual AHI is good Encouraged patient to continue using CPAP nightly and > 4 hours each night Home sleep test ordered.  Pending results we will order new machine Reviewed previous sleep study with the patient F/U after home sleep test    Duwaine Russell, MSN, NP-C 03/04/2024, 12:55 PM [facility] [address], [address] [place], [place] [address] [phone]  The patient's condition requires frequent monitoring and adjustments in the treatment plan, reflecting the ongoing complexity of care.  This provider is the continuing focal point for all needed services for this condition.

## 2024-03-11 ENCOUNTER — Telehealth: Payer: Self-pay | Admitting: Adult Health

## 2024-03-11 DIAGNOSIS — G4733 Obstructive sleep apnea (adult) (pediatric): Secondary | ICD-10-CM

## 2024-03-11 NOTE — Telephone Encounter (Signed)
 HST [employer] pending

## 2024-03-18 ENCOUNTER — Encounter (HOSPITAL_BASED_OUTPATIENT_CLINIC_OR_DEPARTMENT_OTHER): Payer: Self-pay | Admitting: Obstetrics & Gynecology

## 2024-03-18 ENCOUNTER — Ambulatory Visit (INDEPENDENT_AMBULATORY_CARE_PROVIDER_SITE_OTHER): Admitting: Obstetrics & Gynecology

## 2024-03-18 VITALS — BP 121/74 | HR 96 | Ht 70.0 in | Wt 234.4 lb

## 2024-03-18 DIAGNOSIS — R9230 Dense breasts, unspecified: Secondary | ICD-10-CM

## 2024-03-18 DIAGNOSIS — Z90710 Acquired absence of both cervix and uterus: Secondary | ICD-10-CM

## 2024-03-18 DIAGNOSIS — I10 Essential (primary) hypertension: Secondary | ICD-10-CM

## 2024-03-18 DIAGNOSIS — G4733 Obstructive sleep apnea (adult) (pediatric): Secondary | ICD-10-CM

## 2024-03-18 DIAGNOSIS — N3000 Acute cystitis without hematuria: Secondary | ICD-10-CM

## 2024-03-18 DIAGNOSIS — Z01419 Encounter for gynecological examination (general) (routine) without abnormal findings: Secondary | ICD-10-CM

## 2024-03-18 MED ORDER — NITROFURANTOIN MONOHYD MACRO 100 MG PO CAPS: 100.0000 mg | ORAL_CAPSULE | Freq: Two times a day (BID) | ORAL | 0 refills | Status: AC

## 2024-03-18 NOTE — Progress Notes (Signed)
 ANNUAL EXAM Patient name: [name] MRN [id number]  Date of birth: 10-Oct-1960 Chief Complaint:   Gynecologic Exam  History of Present Illness:   [name] is a 63 y.o. G2P2 Caucasian female being seen today for a routine annual exam.  Denies vaginal bleeding.  PCP is Dr. Shayne.  Has done bone density testing in his office.  Plan repeat next year.    On wegovy and lost about 50+ pounds.    No LMP recorded. Patient has had a hysterectomy.  Last pap: Hysterectomy. H/O abnormal pap: no Last mammogram: 02/01/2024. Results were: normal. Family h/o breast cancer: no Last colonoscopy: 08/2023.  Negative.  Follow up 10 years.   DEXA:  done with Dr. Shayne     03/18/2024    2:36 PM 03/21/2021    8:46 AM 06/12/2018    9:34 AM  Depression screen PHQ 2/9  Decreased Interest 0 0 2  Down, Depressed, Hopeless 0 0 1  PHQ - 2 Score 0 0 3  Altered sleeping   0  Tired, decreased energy   3  Change in appetite   2  Feeling bad or failure about yourself    2  Trouble concentrating   0  Moving slowly or fidgety/restless   0  Suicidal thoughts   0  PHQ-9 Score   10  Difficult doing work/chores   Not difficult at all    Review of Systems:   Pertinent items are noted in HPI Denies any urinary or bowel issues.  Denies pelvic pain.   Pertinent History Reviewed:  Reviewed past medical,surgical, social and family history.  Reviewed problem list, medications and allergies. Physical Assessment:   Vitals:   03/18/24 1432  BP: 121/74  Pulse: 96  SpO2: 99%  Weight: 234 lb 6.4 oz (106.3 kg)  Height: 5' 10 (1.778 m)  Body mass index is 33.63 kg/m.        Physical Examination:   General appearance - well appearing, and in no distress  Mental status - alert, oriented to person, place, and time  Psych:  She has a normal mood and affect  Skin - warm and dry, normal color, no suspicious lesions noted  Chest - effort normal, all lung fields clear to auscultation bilaterally  Heart  - normal rate and regular rhythm  Neck:  midline trachea, no thyromegaly or nodules  Breasts - breasts appear normal, no suspicious masses, no skin or nipple changes or  axillary nodes  Abdomen - soft, nontender, nondistended, no masses or organomegaly  Pelvic - VULVA: normal appearing vulva with no masses, tenderness or lesions   VAGINA: normal appearing vagina with normal color and discharge, no lesions   CERVIX: surgically absent  Thin prep pap is not indicated  UTERUS: surgically absent  ADNEXA: No adnexal masses or tenderness noted.  Rectal - normal rectal, good sphincter tone, no masses felt  Extremities:  No swelling or varicosities noted  Chaperone present for exam  No results found for this or any previous visit (from the past 24 hours).  Assessment & Plan:  1. Well woman exam with routine gynecological exam (Primary) - Pap smear not indicated - Mammogram 01/2024 - Colonoscopy 2025 - Bone mineral density done with Dr. Shayne  - lab work done with PCP, Dr. Shayne - vaccines reviewed/updated  2. Essential hypertension, benign  3. OSA on CPAP - followed by Duwaine Russell, NP,  neurology  4. H/O: hysterectomy  5. Acute cystitis without hematuria - nitrofurantoin,  macrocrystal-monohydrate, (MACROBID) 100 MG capsule; Take 1 capsule (100 mg total) by mouth 2 (two) times daily.  Dispense: 10 capsule; Refill: 0   No orders of the defined types were placed in this encounter.   Meds:  Meds ordered this encounter  Medications   nitrofurantoin, macrocrystal-monohydrate, (MACROBID) 100 MG capsule    Sig: Take 1 capsule (100 mg total) by mouth 2 (two) times daily.    Dispense:  10 capsule    Refill:  0    Follow-up: No follow-ups on file.  Ronal GORMAN Pinal, MD 03/18/2024 3:29 PM

## 2024-04-03 NOTE — Telephone Encounter (Signed)
 [employer] [facility] denied the HST. It looks like since no changes have been made with the patient they are good with a new CPAP order placed.

## 2024-04-03 NOTE — Telephone Encounter (Signed)
 [name],  Please call and advise patient that her insurance is not requiring a home sleep test prior to getting a new machine.  I have placed order for new machine.

## 2024-04-03 NOTE — Telephone Encounter (Signed)
 Sent mychart

## 2024-04-03 NOTE — Addendum Note (Signed)
 Addended by: SHERRYL DUWAINE SQUIBB on: 04/03/2024 01:09 PM   Modules accepted: Orders

## 2024-05-13 ENCOUNTER — Telehealth: Payer: Self-pay | Admitting: Adult Health

## 2024-05-13 NOTE — Telephone Encounter (Signed)
 Pt was scheduled for her initial CPAP visit on 07-15-24 DME in [facility], between dates are 05/31/24-07/29/24 Phone rep was told by Rojean, CMA ok to make my chart vv. Pt confirmed she will be in [place] on date and time of appointment  Pt understands that although there may be some limitations with this type of visit, we will take all precautions to reduce any security or privacy concerns.  Pt understands that this will be treated like an in office visit and we will file with pt's insurance, and there may be a patient responsible charge related to this service.

## 2024-07-15 ENCOUNTER — Telehealth: Admitting: Adult Health
